# Patient Record
Sex: Female | Born: 1990 | Race: Black or African American | Hispanic: No | Marital: Single | Smoking: Former smoker
Health system: Southern US, Community
[De-identification: ages and names within clinical notes are randomized; demographics above are authoritative.]

## PROBLEM LIST (undated history)

## (undated) ENCOUNTER — Inpatient Hospital Stay (HOSPITAL_COMMUNITY): Payer: Self-pay

## (undated) DIAGNOSIS — K5090 Crohn's disease, unspecified, without complications: Secondary | ICD-10-CM

## (undated) DIAGNOSIS — R569 Unspecified convulsions: Secondary | ICD-10-CM

## (undated) DIAGNOSIS — I10 Essential (primary) hypertension: Secondary | ICD-10-CM

## (undated) DIAGNOSIS — D573 Sickle-cell trait: Secondary | ICD-10-CM

## (undated) DIAGNOSIS — B009 Herpesviral infection, unspecified: Secondary | ICD-10-CM

## (undated) DIAGNOSIS — Z862 Personal history of diseases of the blood and blood-forming organs and certain disorders involving the immune mechanism: Secondary | ICD-10-CM

## (undated) HISTORY — DX: Sickle-cell trait: D57.3

## (undated) HISTORY — PX: CHOLECYSTECTOMY: SHX55

---

## 2011-06-28 ENCOUNTER — Encounter (HOSPITAL_COMMUNITY): Payer: Self-pay | Admitting: Emergency Medicine

## 2011-06-28 ENCOUNTER — Emergency Department (HOSPITAL_COMMUNITY)
Admission: EM | Admit: 2011-06-28 | Discharge: 2011-06-28 | Disposition: A | Discharge status: Home / Self Care | Payer: Medicaid - Out of State | Attending: Emergency Medicine | Admitting: Emergency Medicine

## 2011-06-28 DIAGNOSIS — S0500XA Injury of conjunctiva and corneal abrasion without foreign body, unspecified eye, initial encounter: Secondary | ICD-10-CM

## 2011-06-28 DIAGNOSIS — H5789 Other specified disorders of eye and adnexa: Secondary | ICD-10-CM | POA: Insufficient documentation

## 2011-06-28 DIAGNOSIS — S058X9A Other injuries of unspecified eye and orbit, initial encounter: Secondary | ICD-10-CM | POA: Insufficient documentation

## 2011-06-28 DIAGNOSIS — F17200 Nicotine dependence, unspecified, uncomplicated: Secondary | ICD-10-CM | POA: Insufficient documentation

## 2011-06-28 DIAGNOSIS — H5710 Ocular pain, unspecified eye: Secondary | ICD-10-CM | POA: Insufficient documentation

## 2011-06-28 DIAGNOSIS — T1590XA Foreign body on external eye, part unspecified, unspecified eye, initial encounter: Secondary | ICD-10-CM | POA: Insufficient documentation

## 2011-06-28 MED ORDER — IBUPROFEN 800 MG PO TABS: 800.0000 mg | ORAL_TABLET | Freq: Three times a day (TID) | ORAL | Status: AC

## 2011-06-28 MED ORDER — TRAMADOL HCL 50 MG PO TABS: 50.0000 mg | ORAL_TABLET | Freq: Four times a day (QID) | ORAL | Status: AC | PRN

## 2011-06-28 MED ORDER — TOBRAMYCIN 0.3 % OP SOLN
2.0000 [drp] | Freq: Four times a day (QID) | OPHTHALMIC | Status: DC
  Administered 2011-06-28: 2 [drp] via OPHTHALMIC
  Filled 2011-06-28: qty 5

## 2011-06-28 MED ORDER — TETRACAINE HCL 0.5 % OP SOLN
1.0000 [drp] | Freq: Once | OPHTHALMIC | Status: AC
  Administered 2011-06-28: 1 [drp] via OPHTHALMIC
  Filled 2011-06-28: qty 2

## 2011-06-28 MED ORDER — FLUORESCEIN SODIUM 1 MG OP STRP
1.0000 | ORAL_STRIP | Freq: Once | OPHTHALMIC | Status: AC
Start: 2011-06-28 — End: 2011-06-28
  Administered 2011-06-28: 1 via OPHTHALMIC

## 2011-06-28 NOTE — ED Notes (Signed)
Pt reports being hit in her right eye while "playing around" yesterday and fake eye lash went into her eye. Eye is red and painful 10/10 today.

## 2011-06-28 NOTE — ED Provider Notes (Signed)
Medical screening examination/treatment/procedure(s) were performed by non-physician practitioner and as supervising physician I was immediately available for consultation/collaboration.  Virgel Manifold, MD 06/28/11 1556

## 2011-06-28 NOTE — ED Provider Notes (Signed)
History     CSN: [id number]  Arrival date & time 06/28/11  1302   First MD Initiated Contact with Patient 06/28/11 1305     2:11 PM HPI Patient which was playing with her niece yesterday when she was hit in the right. Reports her artificial eyelash went into her eye causing severe pain. States his morning she woke up with eye redness, eye pain, a drainage. Reports it feels like something is still in her eye, despite having flushed her eyes out  this morning with water.  Patient is a 21 y.o. female presenting with eye pain. The history is provided by the patient.  Eye Pain This is a new problem. The current episode started yesterday. The problem occurs constantly. The problem has been gradually worsening. Pertinent negatives include no chills, congestion, fever, headaches, nausea, neck pain, rash, swollen glands, visual change, vomiting or weakness. She has tried nothing for the symptoms.    History reviewed. No pertinent past medical history.  History reviewed. No pertinent past surgical history.  No family history on file.  History  Substance Use Topics  . Smoking status: Current Everyday Smoker  . Smokeless tobacco: Not on file  . Alcohol Use: Yes    OB History    Grav Para Term Preterm Abortions TAB SAB Ect Mult Living                  Review of Systems  Constitutional: Negative for fever and chills.  HENT: Negative for congestion, rhinorrhea, neck pain and neck stiffness.   Eyes: Positive for photophobia, pain, discharge and redness.  Gastrointestinal: Negative for nausea and vomiting.  Skin: Negative for rash.  Neurological: Negative for dizziness, weakness and headaches.  All other systems reviewed and are negative.    Allergies  Orange juice  Home Medications   Current Outpatient Rx  Name Route Sig Dispense Refill  . NAPROXEN SODIUM 220 MG PO TABS Oral Take 220 mg by mouth 2 (two) times daily with a meal.      BP 117/87  Pulse 91  Temp 98.7 F (37.1  C)  Resp 18  SpO2 100%  Physical Exam  Vitals reviewed. Constitutional: She is oriented to person, place, and time. Vital signs are normal. She appears well-developed and well-nourished. No distress.  HENT:  Head: Normocephalic and atraumatic.  Eyes: EOM and lids are normal. Pupils are equal, round, and reactive to light. Right conjunctiva is injected. Left conjunctiva is not injected. Right eye exhibits normal extraocular motion. Left eye exhibits normal extraocular motion.  Fundoscopic exam:      The right eye shows no hemorrhage.       The left eye shows no hemorrhage.  Slit lamp exam:      The right eye shows corneal abrasion and fluorescein uptake.  Neck: Neck supple.  Pulmonary/Chest: Effort normal.  Neurological: She is alert and oriented to person, place, and time.  Skin: Skin is warm and dry. No rash noted. No erythema. No pallor.  Psychiatric: She has a normal mood and affect. Her behavior is normal.    ED Course  Procedures   MDM   We'll treat patient with tobramycin drops in analgesic medication. Provided patient with referral for ophthalmologist to followup in one to 2 days. Discussed this with patient sugars of plan and is ready for discharge      Sheliah Mends, PA-C 06/28/11 1420

## 2011-06-28 NOTE — Discharge Instructions (Signed)
Corneal Abrasion The cornea is the clear covering at the front and center of the eye. When looking at the colored portion (iris) of the eye, you are looking through that person's cornea.  This very thin tissue is made up of many layers. The surface layer is a single layer of cells called the corneal epithelium. This is one of the most sensitive tissues in the body. If a scratch or injury causes the corneal epithelium to come off, it is called a corneal abrasion. If the injury extends to the tissues below the epithelium, the condition is called a corneal ulcer.  CAUSES   Scratches.   Trauma.   Foreign body in the eye.   Some people have recurrences of abrasions in the area of the original injury even after they heal. This is called recurrent erosion syndrome. Recurrent erosion syndromes generally improve and go away with time.  SYMPTOMS   Eye pain.   Difficulty or inability to keep the injured eye open.   The eye becomes very sensitive to light.   Recurrent erosions tend to happen suddenly, first thing in the morning - usually upon awakening and opening the eyes.  DIAGNOSIS  Your eye professional can diagnose a corneal abrasion during an eye exam. Dye is usually placed in the eye using a drop or a small paper strip moistened by the patient's tears. When the eye is examined with a special light, the abrasion shows up clearly because of the dye. TREATMENT   Small abrasions may be treated with antibiotic drops or ointment alone.   Usually a pressure patch is specially applied. Pressure patches prevent the eye from blinking, allowing the corneal epithelium to heal. Because blinking is less, a pressure patch also reduces the amount of pain present in the eye during healing. Most corneal abrasions heal within 2-3 days with no effect on vision. WARNING: Do not drive or operate machinery while your eye is patched. Your ability to judge distances is impaired.   If abrasion becomes infected and  spreads to the deeper tissues of the cornea, a corneal ulcer can result. This is serious because it can cause corneal scarring. Corneal scars interfere with light passing through the cornea, and cause a loss of vision in the involved eye.   If your caregiver has given you a follow-up appointment, it is very important to keep that appointment. Not keeping the appointment could result in a severe eye infection or permanent loss of vision. If there is any problem keeping the appointment, you must call back to this facility for assistance.  SEEK MEDICAL CARE IF:   You have pain, light sensitivity and a scratchy feeling in one eye (or both).   Your pressure patch keeps loosening up and you can blink your eye under the patch after treatment.   Any kind of discharge develops from the involved eye after treatment or if the lids stick together in the morning.   You have the same symptoms in the morning as you did with the original abrasion days, weeks or months after the abrasion healed.  MAKE SURE YOU:   Understand these instructions.   Will watch your condition.   Will get help right away if you are not doing well or get worse.  Document Released: 01/27/2000 Document Revised: 01/18/2011 Document Reviewed: 09/04/2007 [facility] Patient Information 2012 [place].

## 2011-09-21 ENCOUNTER — Encounter (HOSPITAL_COMMUNITY): Payer: Self-pay | Admitting: Emergency Medicine

## 2011-09-21 ENCOUNTER — Emergency Department (HOSPITAL_COMMUNITY)
Admission: EM | Admit: 2011-09-21 | Discharge: 2011-09-21 | Disposition: A | Discharge status: Home / Self Care | Payer: Medicaid Other | Attending: Emergency Medicine | Admitting: Emergency Medicine

## 2011-09-21 DIAGNOSIS — F17200 Nicotine dependence, unspecified, uncomplicated: Secondary | ICD-10-CM | POA: Insufficient documentation

## 2011-09-21 DIAGNOSIS — S058X9A Other injuries of unspecified eye and orbit, initial encounter: Secondary | ICD-10-CM | POA: Insufficient documentation

## 2011-09-21 DIAGNOSIS — S0500XA Injury of conjunctiva and corneal abrasion without foreign body, unspecified eye, initial encounter: Secondary | ICD-10-CM

## 2011-09-21 DIAGNOSIS — X58XXXA Exposure to other specified factors, initial encounter: Secondary | ICD-10-CM | POA: Insufficient documentation

## 2011-09-21 MED ORDER — CIPROFLOXACIN HCL 0.3 % OP SOLN: 1.0000 [drp] | Freq: Four times a day (QID) | OPHTHALMIC | Status: AC

## 2011-09-21 MED ORDER — HYDROCODONE-ACETAMINOPHEN 5-325 MG PO TABS: 1.0000 | ORAL_TABLET | ORAL | Status: AC | PRN

## 2011-09-21 MED ORDER — PROPARACAINE HCL 0.5 % OP SOLN
2.0000 [drp] | OPHTHALMIC | Status: AC
  Administered 2011-09-21: 2 [drp] via OPHTHALMIC
  Filled 2011-09-21: qty 15

## 2011-09-21 NOTE — ED Provider Notes (Signed)
History     CSN: [id number]  Arrival date & time 09/21/11  1722   First MD Initiated Contact with Patient 09/21/11 1841      Chief Complaint  Patient presents with  . Eye Pain    (Consider location/radiation/quality/duration/timing/severity/associated sxs/prior treatment) HPI Comments: Pt presents with right eye pain. One month ago she was see at the ED for corneal abrasion from fake eyelashes. She was given antibiotic eye drops and tramadol, and the symptoms improved. Last night, patient felt similar pain in her right eye that has continued throughout the day. The pain is constant and uncomfortable. Pt states she has some blurry vision in her right eye and mild headache. Denies trauma or irritation to her eye or any known exposures. Denies pain with eye movement, discharge, eye or eyelid swelling, diplopia, fever, chills.  Pt does not wear contacts.   Patient is a 21 y.o. female presenting with eye pain. The history is provided by the patient.  Eye Pain Pertinent negatives include no chills or fever.    History reviewed. No pertinent past medical history.  History reviewed. No pertinent past surgical history.  No family history on file.  History  Substance Use Topics  . Smoking status: Current Everyday Smoker    Types: Cigarettes  . Smokeless tobacco: Not on file  . Alcohol Use: No    OB History    Grav Para Term Preterm Abortions TAB SAB Ect Mult Living                  Review of Systems  Constitutional: Negative for fever and chills.  Eyes: Positive for pain and redness. Negative for discharge and itching.  Skin: Negative for wound.    Allergies  Orange juice  Home Medications   Current Outpatient Rx  Name Route Sig Dispense Refill  . BC FAST PAIN RELIEF PO Oral Take 1 packet by mouth daily as needed. Dental pain.    Marland Kitchen TRAMADOL HCL 50 MG PO TABS Oral Take 50 mg by mouth every 6 (six) hours as needed. Pain.    Marland Kitchen CIPROFLOXACIN HCL 0.3 % OP SOLN Right Eye Place  1-2 drops into the right eye 4 (four) times daily. Administer 1 drop, every 2 hours, while awake, for 2 days. Then 1 drop, every 4 hours, while awake, for the next 5 days. 5 mL 0  . HYDROCODONE-ACETAMINOPHEN 5-325 MG PO TABS Oral Take 1 tablet by mouth every 4 (four) hours as needed for pain. 15 tablet 0    BP 117/72  Pulse 89  Temp 98.2 F (36.8 C) (Oral)  Resp 16  SpO2 100%  LMP 09/01/2011  Physical Exam  Nursing note and vitals reviewed. Constitutional: She appears well-developed and well-nourished. No distress.  HENT:  Head: Normocephalic and atraumatic.  Eyes: EOM and lids are normal. Pupils are equal, round, and reactive to light. No foreign bodies found. Right eye exhibits no discharge and no exudate. No foreign body present in the right eye. Right conjunctiva is injected. Right conjunctiva has no hemorrhage. Right eye exhibits normal extraocular motion and no nystagmus.  Slit lamp exam:      The right eye shows corneal abrasion and fluorescein uptake.       Right eye with several small corneal abrasions.  Two over lateral aspect, 9:00/10:00 and one over medial aspect approximately 3:00.  No FB seen.    Neck: Neck supple.  Pulmonary/Chest: Effort normal.  Neurological: She is alert.  Skin: She is not diaphoretic.  ED Course  Procedures (including critical care time)  Labs Reviewed - No data to display No results found.   1. Corneal abrasion       MDM  Pt with repeat corneal abrasion to right eye with no known injury, no FB found on exam.  Pt does not wear contacts.  Pt d/c home with norco, cipro drops, ophthalmology follow up.  Discussed diagnosis and follow up with patient.  Pt given return precautions.  Pt verbalizes understanding and agrees with plan.           [place], [place] 09/21/11 1955

## 2011-09-21 NOTE — ED Provider Notes (Signed)
Medical screening examination/treatment/procedure(s) were performed by non-physician practitioner and as supervising physician I was immediately available for consultation/collaboration.   Malvin Johns, MD 09/21/11 [phone]

## 2011-09-21 NOTE — ED Notes (Signed)
Pt presenting to ed with c/o right eye pain. Pt states she was seen here approximately one month ago due to eyelash cutting her eye. Pt states the pain went away and now it's back and she feels like she has a film over her eye. Pt states her vision is not clear

## 2015-06-13 ENCOUNTER — Ambulatory Visit (HOSPITAL_COMMUNITY)
Admission: EM | Admit: 2015-06-13 | Discharge: 2015-06-13 | Disposition: A | Discharge status: Home / Self Care | Payer: Medicaid Other | Attending: Emergency Medicine | Admitting: Emergency Medicine

## 2015-06-13 ENCOUNTER — Encounter (HOSPITAL_COMMUNITY): Payer: Self-pay | Admitting: Emergency Medicine

## 2015-06-13 DIAGNOSIS — S0501XA Injury of conjunctiva and corneal abrasion without foreign body, right eye, initial encounter: Secondary | ICD-10-CM

## 2015-06-13 MED ORDER — CIPROFLOXACIN HCL 0.3 % OP SOLN: OPHTHALMIC | Status: DC

## 2015-06-13 MED ORDER — TETRACAINE HCL 0.5 % OP SOLN
OPHTHALMIC | Status: AC
Start: 2015-06-13 — End: 2015-06-13
  Filled 2015-06-13: qty 2

## 2015-06-13 MED ORDER — POLYETHYL GLYCOL-PROPYL GLYCOL 0.4-0.3 % OP SOLN: 1.0000 [drp] | Freq: Four times a day (QID) | OPHTHALMIC | Status: DC | PRN

## 2015-06-13 MED ORDER — FLUORESCEIN SODIUM 1 MG OP STRP
ORAL_STRIP | OPHTHALMIC | Status: AC
  Filled 2015-06-13: qty 2

## 2015-06-13 NOTE — ED Notes (Signed)
The patient presented to the [facility] with a complaint of right eye pain that started last night. The patient stated that she was evaluated her prior and told that she has dry eyes and was prescribed some eye drops.

## 2015-06-13 NOTE — ED Provider Notes (Signed)
HPI  SUBJECTIVE:  Kaitlyn Howard is a 25 y.o. female who presents with constant, burning right eye pain, redness, starting this morning. Patient feels like there is "something scraping against her eye".Admits to rubbing her eye last night. She reports photophobia, intermittently blurry vision and increased tearing. She tried Clear Eyes which made her symptoms worse. Symptoms also made worse with exposure to light, closing her eyes, blinking. No alleviating factors.she has not tried anything else for this. She denies nausea, vomiting, fevers, headaches, swelling or redness around the eye, crusting, drainage. She does not wear contacts or glasses. States this feels identical to the 2 right-sided corneal abrasions that she has had in the past Past medical history negative for diabetes, hypertension. LMP: Now. PMD: None.   History reviewed. No pertinent past medical history.  History reviewed. No pertinent past surgical history.  History reviewed. No pertinent family history.  Social History  Substance Use Topics  . Smoking status: Current Every Day Smoker    Types: Cigarettes  . Smokeless tobacco: None  . Alcohol Use: No    No current facility-administered medications for this encounter.  Current outpatient prescriptions:  .  Aspirin-Caffeine (BC FAST PAIN RELIEF PO), Take 1 packet by mouth daily as needed. Dental pain., Disp: , Rfl:  .  ciprofloxacin (CILOXAN) 0.3 % ophthalmic solution, 1-2 drops in affected eye 4 times/day x 5 days, Disp: 5 mL, Rfl: 0 .  Polyethyl Glycol-Propyl Glycol (SYSTANE) 0.4-0.3 % SOLN, Apply 1 drop to eye 4 (four) times daily as needed., Disp: 5 mL, Rfl: 0 .  traMADol (ULTRAM) 50 MG tablet, Take 50 mg by mouth every 6 (six) hours as needed. Pain., Disp: , Rfl:   Allergies  Allergen Reactions  . Penicillins   . Orange Juice [Orange Oil] Rash     ROS  As noted in HPI.   Physical Exam  BP 120/80 mmHg  Pulse 68  Temp(Src) 98.3 F (36.8 C)  Resp 12   SpO2 100%  LMP 06/13/2015 (Exact Date)  Constitutional: Well developed, well nourished, no acute distress Eyes:  EOMI, PERRLA, right-sided conjunctival injection. Mild direct photophobia. No consensual photophobia. No foreign body seen on lid eversion. Positive peripheral right-sided corneal abrasion seen from the 8:00 to the 10:00 position.     Visual Acuity  Right Eye Distance: 20/50 Left Eye Distance: 20/40 Bilateral Distance: 20/40  Right Eye Near:   Left Eye Near:    Bilateral Near:    HENT: Normocephalic, atraumatic,mucus membranes moist Respiratory: Normal inspiratory effort Cardiovascular: Normal rate GI: nondistended skin: No rash, skin intact Musculoskeletal: no deformities Neurologic: Alert & oriented x 3, no focal neuro deficits Psychiatric: Speech and behavior appropriate   ED Course   Medications - No data to display  Orders Placed This Encounter  Procedures  . Visual acuity screening    Standing Status: Standing     Number of Occurrences: 1     Standing Expiration Date:     No results found for this or any previous visit (from the past 24 hour(s)). No results found.  ED Clinical Impression  Corneal abrasion, right, initial encounter   ED Assessment/Plan  Slightly decreased visual acuity on the right side. Advised patient that she needs glasses for driving , especially at night, and for seeing distances. Patient declined pain medication. We'll send home with Cipro eyedrops, cool compresses, Systane eye drops, advised patient to wear sunglasses as needed for photophobia. She will follow-up with Dr. Midge Aver, ophthalmology on call if no  better in 1-2 days. Discussed MDM, plan and followup with patient  Discussed sn/sx that should prompt return to the ED. Patient agrees with plan.   *This clinic note was created using Dragon dictation software. Therefore, there may be occasional mistakes despite careful proofreading.  ?   Melynda Ripple,  MD 06/14/15 [phone]

## 2015-06-28 ENCOUNTER — Emergency Department (HOSPITAL_COMMUNITY)
Admission: EM | Admit: 2015-06-28 | Discharge: 2015-06-28 | Disposition: A | Discharge status: Home / Self Care | Payer: Medicaid Other | Attending: Emergency Medicine | Admitting: Emergency Medicine

## 2015-06-28 ENCOUNTER — Encounter (HOSPITAL_COMMUNITY): Payer: Self-pay | Admitting: Emergency Medicine

## 2015-06-28 DIAGNOSIS — X58XXXA Exposure to other specified factors, initial encounter: Secondary | ICD-10-CM | POA: Insufficient documentation

## 2015-06-28 DIAGNOSIS — Y9389 Activity, other specified: Secondary | ICD-10-CM | POA: Insufficient documentation

## 2015-06-28 DIAGNOSIS — T162XXA Foreign body in left ear, initial encounter: Secondary | ICD-10-CM | POA: Diagnosis present

## 2015-06-28 DIAGNOSIS — Y999 Unspecified external cause status: Secondary | ICD-10-CM | POA: Insufficient documentation

## 2015-06-28 DIAGNOSIS — Y929 Unspecified place or not applicable: Secondary | ICD-10-CM | POA: Diagnosis not present

## 2015-06-28 DIAGNOSIS — F17210 Nicotine dependence, cigarettes, uncomplicated: Secondary | ICD-10-CM | POA: Diagnosis not present

## 2015-06-28 DIAGNOSIS — S00452A Superficial foreign body of left ear, initial encounter: Secondary | ICD-10-CM

## 2015-06-28 HISTORY — DX: Personal history of diseases of the blood and blood-forming organs and certain disorders involving the immune mechanism: Z86.2

## 2015-06-28 MED ORDER — LIDOCAINE-EPINEPHRINE-TETRACAINE (LET) SOLUTION: 3.0000 mL | Freq: Once | NASAL | Status: DC

## 2015-06-28 NOTE — ED Provider Notes (Signed)
CSN: [id number]     Arrival date & time 06/28/15  1220 History   By signing my name below, I, Kaitlyn Howard, attest that this documentation has been prepared under the direction and in the presence of non-physician practitioner, Eliezer Mccoy, [place]. Electronically Signed: Rowan Howard, Scribe. 06/28/2015. 1:14 PM.   Chief Complaint  Patient presents with  . Foreign Body in [place] in l/ear lobe   The history is provided by the patient. No language interpreter was used.   HPI Comments:  Kaitlyn Howard is a 25 y.o. female who presents to the Emergency Department complaining of foreign body in left tragus. Pt was at the hairdresser and they accidentally applied pressure to her metal earring, pushing the front of her earring into her skin. Prior to exam, pt was able to push the earring back out of her lobe. She states she needs help taking off the back of the earring and removing it from her ear. Pt recently had this piercing placed and notes difficulty cleaning the piercingSince was placed 4 weeks ago. Pt reports associated mild dizziness. Denies inner ear pain, foreign body in ear, shortness of breath, chest pain, nausea or vomiting  Past Medical History  Diagnosis Date  . H/O sickle cell trait    Past Surgical History  Procedure Laterality Date  . Cesarean section     Family History  Problem Relation Age of Onset  . Hypertension Mother   . Hypertension Father    Social History  Substance Use Topics  . Smoking status: Current Every Day Smoker    Types: Cigarettes  . Smokeless tobacco: None  . Alcohol Use: Yes   OB History    No data available     Review of Systems  Constitutional: Negative for fever and chills.  HENT: Positive for ear pain (external). Negative for facial swelling and sore throat.   Respiratory: Negative for shortness of breath.   Cardiovascular: Negative for chest pain.  Gastrointestinal: Negative for nausea, vomiting and abdominal pain.   Genitourinary: Negative for dysuria.  Musculoskeletal: Negative for back pain.  Skin: Negative for rash and wound.  Neurological: Positive for dizziness. Negative for headaches.  Psychiatric/Behavioral: The patient is not nervous/anxious.    Allergies  Penicillins and Orange juice  Home Medications   Prior to Admission medications   Medication Sig Start Date End Date Taking? Authorizing Provider  Polyethyl Glycol-Propyl Glycol (SYSTANE) 0.4-0.3 % SOLN Apply 1 drop to eye 4 (four) times daily as needed. Patient taking differently: Apply 2 drops to eye daily as needed (painful eyes).  06/13/15  Yes Melynda Ripple, MD  ciprofloxacin (CILOXAN) 0.3 % ophthalmic solution 1-2 drops in affected eye 4 times/day x 5 days Patient not taking: Reported on 06/28/2015 06/13/15   Melynda Ripple, MD   BP 106/72 mmHg  Pulse 78  Temp(Src) 98.4 F (36.9 C)  Resp 16  Wt 54.432 kg  SpO2 100%  LMP 06/28/2015 (Exact Date) Physical Exam  Constitutional: She appears well-developed and well-nourished. No distress.  HENT:  Head: Normocephalic and atraumatic.  Mouth/Throat: Oropharynx is clear and moist. No oropharyngeal exudate.  Stud earring sunken into tragus of left ear  Eyes: Conjunctivae are normal. Pupils are equal, round, and reactive to light. Right eye exhibits no discharge. Left eye exhibits no discharge. No scleral icterus.  Neck: Normal range of motion. Neck supple. No thyromegaly present.  Cardiovascular: Normal rate, regular rhythm, normal heart sounds and intact distal pulses.  Exam reveals no gallop  and no friction rub.   No murmur heard. Pulmonary/Chest: Effort normal and breath sounds normal. No stridor. No respiratory distress. She has no wheezes. She has no rales.  Abdominal: Soft. Bowel sounds are normal. She exhibits no distension. There is no tenderness. There is no rebound and no guarding.  Musculoskeletal: She exhibits no edema.  Lymphadenopathy:    She has no cervical  adenopathy.  Neurological: She is alert. Coordination normal.  Skin: Skin is warm and dry. No rash noted. She is not diaphoretic. No pallor.  Psychiatric: She has a normal mood and affect.  Nursing note and vitals reviewed.   ED Course  Procedures  DIAGNOSTIC STUDIES:  Oxygen Saturation is 100% on RA, normal by my interpretation.    COORDINATION OF CARE:  1:08 PM Will attempt to remove piercing. Discussed treatment plan with pt at bedside and pt agreed to plan.  Labs Review Labs Reviewed - No data to display  Imaging Review No results found. I have personally reviewed and evaluated these images and lab results as part of my medical decision-making.   EKG Interpretation None      MDM   Patient presenting for removal of earring. When trying to remove earring with hemostats and tweezers, patient moved and stated she was in a lot of pain. When I went to get LET to ease removal, a nurse was able to remove earring with fingers by requested the patient. Patient was in a rush to leave. I recommended applying bacitracin ointment for 1 week and not to replace earring. Return precautions discussed. Patient vitals stable throughout ED course and discharged in satisfactory condition.  Final diagnoses:  Foreign body in ear lobe, left, initial encounter    I personally performed the services described in this documentation, which was scribed in my presence. The recorded information has been reviewed and is accurate.    Frederica Kuster, PA-C 06/28/15 1401  Merrily Pew, MD 06/28/15 1620

## 2015-06-28 NOTE — ED Notes (Signed)
Pt stated that she felt a popping sensation in l/earlobe. Pt was having her hair done and earring was pushed into earlobe. Stated that ball of earring is imbedded in l/earlobe

## 2015-06-28 NOTE — Discharge Instructions (Signed)
Apply antibiotic ointment on the affected area for 1 week. Do not put an earring back in the hole. Please return to the emergency department if you develop and fevers, increasing pain, redness, swelling, or drainage, or any other new or concerning symptom.

## 2015-12-30 ENCOUNTER — Emergency Department (HOSPITAL_COMMUNITY)
Admission: EM | Admit: 2015-12-30 | Discharge: 2015-12-30 | Disposition: A | Discharge status: Home / Self Care | Payer: Medicaid Other | Attending: Emergency Medicine | Admitting: Emergency Medicine

## 2015-12-30 ENCOUNTER — Encounter (HOSPITAL_COMMUNITY): Payer: Self-pay | Admitting: Emergency Medicine

## 2015-12-30 DIAGNOSIS — R1900 Intra-abdominal and pelvic swelling, mass and lump, unspecified site: Secondary | ICD-10-CM | POA: Insufficient documentation

## 2015-12-30 DIAGNOSIS — R7989 Other specified abnormal findings of blood chemistry: Secondary | ICD-10-CM

## 2015-12-30 DIAGNOSIS — R1033 Periumbilical pain: Secondary | ICD-10-CM

## 2015-12-30 DIAGNOSIS — E349 Endocrine disorder, unspecified: Secondary | ICD-10-CM

## 2015-12-30 LAB — COMPREHENSIVE METABOLIC PANEL
ALK PHOS: 60 U/L (ref 38–126)
ALT: 24 U/L (ref 14–54)
AST: 27 U/L (ref 15–41)
Albumin: 4 g/dL (ref 3.5–5.0)
Anion gap: 10 (ref 5–15)
BILIRUBIN TOTAL: 0.3 mg/dL (ref 0.3–1.2)
BUN: 5 mg/dL — AB (ref 6–20)
CALCIUM: 9.3 mg/dL (ref 8.9–10.3)
CHLORIDE: 106 mmol/L (ref 101–111)
CO2: 24 mmol/L (ref 22–32)
CREATININE: 0.76 mg/dL (ref 0.44–1.00)
GFR calc Af Amer: >60 mL/min (ref >60)
Glucose, Bld: 69 mg/dL (ref 65–99)
Potassium: 3.6 mmol/L (ref 3.5–5.1)
Sodium: 140 mmol/L (ref 135–145)
Total Protein: 7.1 g/dL (ref 6.5–8.1)

## 2015-12-30 LAB — URINALYSIS, ROUTINE W REFLEX MICROSCOPIC
Bilirubin Urine: NEGATIVE
GLUCOSE, UA: NEGATIVE mg/dL
HGB URINE DIPSTICK: NEGATIVE
KETONES UR: NEGATIVE mg/dL
LEUKOCYTES UA: NEGATIVE
Nitrite: NEGATIVE
PROTEIN: NEGATIVE mg/dL
Specific Gravity, Urine: 1.006 (ref 1.005–1.030)
pH: 6 (ref 5.0–8.0)

## 2015-12-30 LAB — LIPASE, BLOOD: LIPASE: 25 U/L (ref 11–51)

## 2015-12-30 LAB — I-STAT BETA HCG BLOOD, ED (MC, WL, AP ONLY): I-stat hCG, quantitative: 68.3 m[IU]/mL — ABNORMAL HIGH (ref <5)

## 2015-12-30 LAB — CBC
HCT: 39.4 % (ref 36.0–46.0)
Hemoglobin: 13.6 g/dL (ref 12.0–15.0)
MCH: 25.7 pg — AB (ref 26.0–34.0)
MCHC: 34.5 g/dL (ref 30.0–36.0)
MCV: 74.5 fL — AB (ref 78.0–100.0)
PLATELETS: 240 10*3/uL (ref 150–400)
RBC: 5.29 MIL/uL — ABNORMAL HIGH (ref 3.87–5.11)
RDW: 14.6 % (ref 11.5–15.5)
WBC: 10.4 10*3/uL (ref 4.0–10.5)

## 2015-12-30 NOTE — ED Triage Notes (Signed)
Patient reports mid abdominal pain onset 2 days ago , denies emesis or diarrhea , no fever or chills , pt. added small lump at mid abdomen today .

## 2015-12-30 NOTE — ED Provider Notes (Signed)
[facility] DEPT Provider Note   CSN: [id number] Arrival date & time: 12/30/15  1943     History   Chief Complaint Chief Complaint  Patient presents with  . Abdominal Pain    Lump    HPI See Beharry is a 25 y.o. female.  Patient presents with concern for a painful knot or lump in the mid-abdomen x 2 days. No fever, injury, emesis, change in bowel habits. She rates the pain as a 4 on a 10 scale with palpation. It does not affect her appetite or activity.    The history is provided by the patient. No language interpreter was used.  Abdominal Pain   Pertinent negatives include fever, diarrhea, nausea, vomiting, constipation and dysuria.    Past Medical History:  Diagnosis Date  . H/O sickle cell trait     There are no active problems to display for this patient.   Past Surgical History:  Procedure Laterality Date  . CESAREAN SECTION      OB History    No data available       Home Medications    Prior to Admission medications   Medication Sig Start Date End Date Taking? Authorizing Provider  ciprofloxacin (CILOXAN) 0.3 % ophthalmic solution 1-2 drops in affected eye 4 times/day x 5 days Patient not taking: Reported on 12/30/2015 06/13/15   Melynda Ripple, MD  Polyethyl Glycol-Propyl Glycol (SYSTANE) 0.4-0.3 % SOLN Apply 1 drop to eye 4 (four) times daily as needed. Patient not taking: Reported on 12/30/2015 06/13/15   Melynda Ripple, MD    Family History Family History  Problem Relation Age of Onset  . Hypertension Mother   . Hypertension Father     Social History Social History  Substance Use Topics  . Smoking status: Current Every Day Smoker    Types: Cigarettes  . Smokeless tobacco: Never Used  . Alcohol use Yes     Allergies   Penicillins and Orange juice [orange oil]   Review of Systems Review of Systems  Constitutional: Negative for chills and fever.  HENT: Negative.   Respiratory: Negative.   Cardiovascular: Negative.     Gastrointestinal: Positive for abdominal pain. Negative for constipation, diarrhea, nausea and vomiting.  Genitourinary: Negative.  Negative for dysuria and pelvic pain.  Musculoskeletal: Negative.   Neurological: Negative.      Physical Exam Updated Vital Signs BP 100/62 (BP Location: Left Arm)   Pulse 63   Temp 98.4 F (36.9 C) (Oral)   Resp 14   Ht 5' 7"  (1.702 m)   Wt 58.1 kg   LMP 12/23/2015 (Approximate)   SpO2 99%   BMI 20.05 kg/m   Physical Exam  Constitutional: She is oriented to person, place, and time. She appears well-developed and well-nourished.  Neck: Normal range of motion.  Pulmonary/Chest: Effort normal.  Abdominal: Normal appearance. There is tenderness.    Palpable firm, tender 2 cm mass above the umbilicus.  Neurological: She is alert and oriented to person, place, and time.  Skin: Skin is warm and dry.     ED Treatments / Results  Labs (all labs ordered are listed, but only abnormal results are displayed) Labs Reviewed  COMPREHENSIVE METABOLIC PANEL - Abnormal; Notable for the following:       Result Value   BUN 5 (*)    All other components within normal limits  CBC - Abnormal; Notable for the following:    RBC 5.29 (*)    MCV 74.5 (*)    [facility]  25.7 (*)    All other components within normal limits  I-STAT BETA HCG BLOOD, ED ([facility], [facility], [facility] ONLY) - Abnormal; Notable for the following:    I-stat hCG, quantitative 68.3 (*)    All other components within normal limits  LIPASE, BLOOD  URINALYSIS, ROUTINE W REFLEX MICROSCOPIC (NOT AT [facility])  HCG, QUANTITATIVE, PREGNANCY    EKG  EKG Interpretation None       Radiology No results found.  Procedures Procedures (including critical care time)  Medications Ordered in ED Medications - No data to display   Initial Impression / Assessment and Plan / ED Course  I have reviewed the triage vital signs and the nursing notes.  Pertinent labs & imaging results that were available during my care  of the patient were reviewed by me and considered in my medical decision making (see chart for details).  Clinical Course     Patient presents with a tender palpable mass in periumbilical area x 2 days. No other symptoms. Discussed likelihood of a fat containing hernia. She is examined by Dr. Venora Maples and found stable for discharge home.  She has an elevated HCG today. She reports she is on Depo injections and has abnormal periods, noting a heavier than usual period last month. Pregnancy test was discussed by Dr. Venora Maples who advised the patient of the importance in getting a repeat test to determine progression of the pregnancy. [facility]  Final Clinical Impressions(s) / ED Diagnoses   Final diagnoses:  None   1. Abdominal pain 2. Abdominal mass 3. Elevated HCG  New Prescriptions New Prescriptions   No medications on file     Charlann Lange, Hershal Coria 12/30/15 [place], MD 12/30/15 2358

## 2015-12-30 NOTE — ED Notes (Signed)
Pt departed in NAD.  

## 2015-12-31 LAB — HCG, QUANTITATIVE, PREGNANCY: HCG, BETA CHAIN, QUANT, S: 79 m[IU]/mL — AB (ref <5)

## 2016-01-03 ENCOUNTER — Encounter (HOSPITAL_COMMUNITY): Payer: Self-pay | Admitting: Emergency Medicine

## 2016-01-03 ENCOUNTER — Emergency Department (HOSPITAL_COMMUNITY)
Admission: EM | Admit: 2016-01-03 | Discharge: 2016-01-04 | Disposition: A | Discharge status: Home / Self Care | Payer: Medicaid Other | Attending: Emergency Medicine | Admitting: Emergency Medicine

## 2016-01-03 ENCOUNTER — Other Ambulatory Visit: Payer: Self-pay

## 2016-01-03 ENCOUNTER — Telehealth (HOSPITAL_BASED_OUTPATIENT_CLINIC_OR_DEPARTMENT_OTHER): Payer: Self-pay | Admitting: Emergency Medicine

## 2016-01-03 ENCOUNTER — Emergency Department (HOSPITAL_COMMUNITY): Payer: Medicaid Other

## 2016-01-03 DIAGNOSIS — K429 Umbilical hernia without obstruction or gangrene: Secondary | ICD-10-CM | POA: Diagnosis not present

## 2016-01-03 DIAGNOSIS — Z3A01 Less than 8 weeks gestation of pregnancy: Secondary | ICD-10-CM | POA: Diagnosis not present

## 2016-01-03 DIAGNOSIS — R1033 Periumbilical pain: Secondary | ICD-10-CM

## 2016-01-03 DIAGNOSIS — O99611 Diseases of the digestive system complicating pregnancy, first trimester: Secondary | ICD-10-CM | POA: Diagnosis not present

## 2016-01-03 DIAGNOSIS — R1031 Right lower quadrant pain: Secondary | ICD-10-CM

## 2016-01-03 DIAGNOSIS — O99331 Smoking (tobacco) complicating pregnancy, first trimester: Secondary | ICD-10-CM | POA: Insufficient documentation

## 2016-01-03 DIAGNOSIS — Z3490 Encounter for supervision of normal pregnancy, unspecified, unspecified trimester: Secondary | ICD-10-CM

## 2016-01-03 DIAGNOSIS — E349 Endocrine disorder, unspecified: Secondary | ICD-10-CM

## 2016-01-03 DIAGNOSIS — O26891 Other specified pregnancy related conditions, first trimester: Secondary | ICD-10-CM | POA: Diagnosis present

## 2016-01-03 DIAGNOSIS — F17210 Nicotine dependence, cigarettes, uncomplicated: Secondary | ICD-10-CM | POA: Insufficient documentation

## 2016-01-03 LAB — LIPASE, BLOOD: LIPASE: 20 U/L (ref 11–51)

## 2016-01-03 LAB — CBC
HEMATOCRIT: 37 % (ref 36.0–46.0)
HEMOGLOBIN: 12.8 g/dL (ref 12.0–15.0)
MCH: 26.1 pg (ref 26.0–34.0)
MCHC: 34.6 g/dL (ref 30.0–36.0)
MCV: 75.4 fL — AB (ref 78.0–100.0)
Platelets: 226 10*3/uL (ref 150–400)
RBC: 4.91 MIL/uL (ref 3.87–5.11)
RDW: 14.9 % (ref 11.5–15.5)
WBC: 12 10*3/uL — AB (ref 4.0–10.5)

## 2016-01-03 LAB — COMPREHENSIVE METABOLIC PANEL
ALBUMIN: 4 g/dL (ref 3.5–5.0)
ALT: 17 U/L (ref 14–54)
ANION GAP: 7 (ref 5–15)
AST: 15 U/L (ref 15–41)
Alkaline Phosphatase: 59 U/L (ref 38–126)
BILIRUBIN TOTAL: 0.6 mg/dL (ref 0.3–1.2)
BUN: 12 mg/dL (ref 6–20)
CHLORIDE: 106 mmol/L (ref 101–111)
CO2: 26 mmol/L (ref 22–32)
Calcium: 9.1 mg/dL (ref 8.9–10.3)
Creatinine, Ser: 0.72 mg/dL (ref 0.44–1.00)
GFR calc Af Amer: >60 mL/min (ref >60)
Glucose, Bld: 56 mg/dL — ABNORMAL LOW (ref 65–99)
POTASSIUM: 3.2 mmol/L — AB (ref 3.5–5.1)
Sodium: 139 mmol/L (ref 135–145)
TOTAL PROTEIN: 7.1 g/dL (ref 6.5–8.1)

## 2016-01-03 LAB — URINALYSIS, ROUTINE W REFLEX MICROSCOPIC
BILIRUBIN URINE: NEGATIVE
GLUCOSE, UA: NEGATIVE mg/dL
HGB URINE DIPSTICK: NEGATIVE
Ketones, ur: NEGATIVE mg/dL
Leukocytes, UA: NEGATIVE
NITRITE: NEGATIVE
PH: 6 (ref 5.0–8.0)
Protein, ur: NEGATIVE mg/dL
SPECIFIC GRAVITY, URINE: 1.017 (ref 1.005–1.030)

## 2016-01-03 LAB — I-STAT BETA HCG BLOOD, ED (MC, WL, AP ONLY): I-stat hCG, quantitative: 563.8 m[IU]/mL — ABNORMAL HIGH (ref <5)

## 2016-01-03 MED ORDER — SODIUM CHLORIDE 0.9 % IV SOLN
Freq: Once | INTRAVENOUS | Status: AC
  Administered 2016-01-03: via INTRAVENOUS

## 2016-01-03 MED ORDER — HYDROMORPHONE HCL 1 MG/ML IJ SOLN
1.0000 mg | Freq: Once | INTRAMUSCULAR | Status: AC
  Administered 2016-01-03: 1 mg via INTRAVENOUS
  Filled 2016-01-03: qty 1

## 2016-01-03 MED ORDER — ONDANSETRON HCL 4 MG/2ML IJ SOLN
4.0000 mg | Freq: Once | INTRAMUSCULAR | Status: AC
  Administered 2016-01-03: 4 mg via INTRAVENOUS
  Filled 2016-01-03: qty 2

## 2016-01-03 NOTE — ED Triage Notes (Signed)
Patient stated that she has a hernia that she was just in the hospital for. Patient states that she feels like she is getting worse. She is complaining of abdominal pain.

## 2016-01-03 NOTE — ED Provider Notes (Signed)
[name] DEPT Provider Note   CSN: [id number] Arrival date & time: 01/03/16  2018  By signing my name below, I, Kaitlyn Howard, attest that this documentation has been prepared under the direction and in the presence of Junius Creamer, NP Electronically Signed: Soijett Howard, ED Scribe. 01/03/16. 10:38 PM.  History   Chief Complaint Chief Complaint  Patient presents with  . Abdominal Pain    HPI Kaitlyn Howard is a 25 y.o. female who presents to the Emergency Department complaining of gradually worsening abdominal pain onset 6 days ago. Pt notes that she was seen in the ED 4 days ago for similar symptoms and informed that she may have a hernia. Pt reports that her abdominal pain has since radiated from her umbilicus area to her RLQ and that is what prompted her to come into the ED tonight. Pt reports that her LMP was 2 weeks ago and she used depo provera as her contraceptive measures, but she notes that she was a month late for her depo provera. Pt reports that her last pregnancy was in 2011. She states that she has not tried any medications for the relief for her symptoms. She denies vomiting, diarrhea, vaginal discharge, vaginal bleeding, and any other symptoms.     The history is provided by the patient. No language interpreter was used.    Past Medical History:  Diagnosis Date  . H/O sickle cell trait     There are no active problems to display for this patient.   Past Surgical History:  Procedure Laterality Date  . CESAREAN SECTION      OB History    No data available       Home Medications    Prior to Admission medications   Medication Sig Start Date End Date Taking? Authorizing Provider  ciprofloxacin (CILOXAN) 0.3 % ophthalmic solution 1-2 drops in affected eye 4 times/day x 5 days Patient not taking: Reported on 01/03/2016 06/13/15   Melynda Ripple, MD  HYDROcodone-acetaminophen (NORCO/VICODIN) 5-325 MG tablet Take 1 tablet by mouth every 6 (six) hours as  needed for severe pain. 01/04/16   Junius Creamer, NP  Polyethyl Glycol-Propyl Glycol (SYSTANE) 0.4-0.3 % SOLN Apply 1 drop to eye 4 (four) times daily as needed. Patient not taking: Reported on 01/03/2016 06/13/15   Melynda Ripple, MD    Family History Family History  Problem Relation Age of Onset  . Hypertension Mother   . Hypertension Father     Social History Social History  Substance Use Topics  . Smoking status: Current Every Day Smoker    Types: Cigarettes  . Smokeless tobacco: Never Used  . Alcohol use Yes     Allergies   Penicillins and Orange juice [orange oil]   Review of Systems Review of Systems  Gastrointestinal: Positive for abdominal pain. Negative for diarrhea and vomiting.  Genitourinary: Negative for vaginal bleeding and vaginal discharge.  All other systems reviewed and are negative.    Physical Exam Updated Vital Signs BP 103/63 (BP Location: Left Arm)   Pulse 77   Temp 98.7 F (37.1 C) (Oral)   Resp 18   Ht 5' 7"  (1.702 m)   Wt 128 lb (58.1 kg)   LMP 12/15/2015 (Approximate)   SpO2 100%   BMI 20.05 kg/m   Physical Exam  Constitutional: She is oriented to person, place, and time. She appears well-developed and well-nourished. No distress.  HENT:  Head: Normocephalic and atraumatic.  Eyes: EOM are normal.  Neck: Neck supple.  Cardiovascular:  Normal rate, regular rhythm and normal heart sounds.  Exam reveals no gallop and no friction rub.   No murmur heard. Pulmonary/Chest: Effort normal and breath sounds normal. No respiratory distress. She has no wheezes. She has no rales.  Abdominal: Soft. She exhibits no distension. There is tenderness in the right lower quadrant.  Tenderness noted to right mid and RLQ. Small defect noted to umbilicus.   Musculoskeletal: Normal range of motion.  Neurological: She is alert and oriented to person, place, and time.  Skin: Skin is warm and dry.  Psychiatric: She has a normal mood and affect. Her behavior  is normal.  Nursing note and vitals reviewed.    ED Treatments / Results  DIAGNOSTIC STUDIES: Oxygen Saturation is 100% on RA, nl by my interpretation.    COORDINATION OF CARE: 10:34 PM Discussed treatment plan with pt at bedside which includes labs, UA, US OB, and pt agreed to plan.   Labs (all labs ordered are listed, but only abnormal results are displayed) Labs Reviewed  COMPREHENSIVE METABOLIC PANEL - Abnormal; Notable for the following:       Result Value   Potassium 3.2 (*)    Glucose, Bld 56 (*)    All other components within normal limits  CBC - Abnormal; Notable for the following:    WBC 12.0 (*)    MCV 75.4 (*)    All other components within normal limits  I-STAT BETA HCG BLOOD, ED ([facility], [facility], [facility] ONLY) - Abnormal; Notable for the following:    I-stat hCG, quantitative 563.8 (*)    All other components within normal limits  LIPASE, BLOOD  URINALYSIS, ROUTINE W REFLEX MICROSCOPIC (NOT AT [facility])  TYPE AND SCREEN  ABO/RH    EKG  EKG Interpretation None       Radiology US Ob Comp Less 14 Wks  Result Date: 01/04/2016 CLINICAL DATA:  Pelvic pain for 6 days, gradually increasing. Pain radiates from the umbilicus to the right lower quadrant. Early pregnancy. Estimated gestational age by LMP is 2 weeks 5 days. Quantitative beta HCG is 563, rising from 12/30/15. EXAM: OBSTETRIC <14 WK [place] AND TRANSVAGINAL OB US TECHNIQUE: Both transabdominal and transvaginal ultrasound examinations were performed for complete evaluation of the gestation as well as the maternal uterus, adnexal regions, and pelvic cul-de-sac. Transvaginal technique was performed to assess early pregnancy. COMPARISON:  None. FINDINGS: Intrauterine gestational sac: Not identified. Yolk sac:  Not identified. Embryo:  Not identified. Cardiac Activity: Not identified. Maternal uterus/adnexae: Uterus is retroverted. No myometrial mass lesions identified. Endometrial stripe thickness is normal. Uterus measures 8.5 x  4.5 x 4.9 cm transabdominal eat. Endometrial stripe measures 9.4 mm. Right ovary measures 5.8 x 2.6 x 3.3 cm. Left ovary measures 2.9 x 1.9 x 2.5 cm. No abnormal adnexal masses. Normal follicular cysts. Images were obtained at the umbilicus where the pain is localized. A small fat containing periumbilical hernia was noted during Valsalva. No bowel herniation. IMPRESSION: No intrauterine gestational sac, yolk sac, or fetal pole identified. Differential considerations include intrauterine pregnancy too early to be sonographically visualized, missed abortion, or ectopic pregnancy. Followup ultrasound is recommended in 10-14 days for further evaluation. Incidental note of a small fat containing periumbilical hernia. Electronically Signed   By: Lucienne Capers M.D.   On: 01/04/2016 00:45   US Ob Transvaginal  Result Date: 01/04/2016 CLINICAL DATA:  Pelvic pain for 6 days, gradually increasing. Pain radiates from the umbilicus to the right lower quadrant. Early pregnancy. Estimated gestational age by  LMP is 2 weeks 5 days. Quantitative beta HCG is 563, rising from 12/30/15. EXAM: OBSTETRIC <14 WK [place] AND TRANSVAGINAL OB US TECHNIQUE: Both transabdominal and transvaginal ultrasound examinations were performed for complete evaluation of the gestation as well as the maternal uterus, adnexal regions, and pelvic cul-de-sac. Transvaginal technique was performed to assess early pregnancy. COMPARISON:  None. FINDINGS: Intrauterine gestational sac: Not identified. Yolk sac:  Not identified. Embryo:  Not identified. Cardiac Activity: Not identified. Maternal uterus/adnexae: Uterus is retroverted. No myometrial mass lesions identified. Endometrial stripe thickness is normal. Uterus measures 8.5 x 4.5 x 4.9 cm transabdominal eat. Endometrial stripe measures 9.4 mm. Right ovary measures 5.8 x 2.6 x 3.3 cm. Left ovary measures 2.9 x 1.9 x 2.5 cm. No abnormal adnexal masses. Normal follicular cysts. Images were obtained at the  umbilicus where the pain is localized. A small fat containing periumbilical hernia was noted during Valsalva. No bowel herniation. IMPRESSION: No intrauterine gestational sac, yolk sac, or fetal pole identified. Differential considerations include intrauterine pregnancy too early to be sonographically visualized, missed abortion, or ectopic pregnancy. Followup ultrasound is recommended in 10-14 days for further evaluation. Incidental note of a small fat containing periumbilical hernia. Electronically Signed   By: Lucienne Capers M.D.   On: 01/04/2016 00:45    Procedures Procedures (including critical care time)  Medications Ordered in ED Medications  0.9 %  sodium chloride infusion ( Intravenous Stopped 01/04/16 0129)  HYDROmorphone (DILAUDID) injection 1 mg (1 mg Intravenous Given 01/03/16 2347)  ondansetron (ZOFRAN) injection 4 mg (4 mg Intravenous Given 01/03/16 2347)     Initial Impression / Assessment and Plan / ED Course  I have reviewed the triage vital signs and the nursing notes.  Pertinent labs & imaging results that were available during my care of the patient were reviewed by me and considered in my medical decision making (see chart for details).  Clinical Course      Patient quantitative beta is resumed normally.  Her ultrasound shows no gestational sac in the uterus.  No adnexal mass.  I did speak with OB/GYN who request a redraw for quantitative beta and 2 days at MAU.  This was discussed with patient who understands and will follow-up.  She was given strict return parameters  Final Clinical Impressions(s) / ED Diagnoses   Final diagnoses:  Periumbilical abdominal pain  Less than [redacted] weeks gestation of pregnancy  Umbilical hernia without obstruction and without gangrene    New Prescriptions Discharge Medication List as of 01/04/2016  1:28 AM    START taking these medications   Details  HYDROcodone-acetaminophen (NORCO/VICODIN) 5-325 MG tablet Take 1 tablet by  mouth every 6 (six) hours as needed for severe pain., Starting Wed 01/04/2016, Print       I personally performed the services described in this documentation, which was scribed in my presence. The recorded information has been reviewed and is accurate.    Junius Creamer, NP 01/04/16 2126    Junius Creamer, NP 01/05/16 2800    Everlene Balls, MD 01/06/16 [phone]

## 2016-01-04 LAB — TYPE AND SCREEN
ABO/RH(D): A POS
Antibody Screen: NEGATIVE

## 2016-01-04 LAB — ABO/RH: ABO/RH(D): A POS

## 2016-01-04 LAB — HCG, QUANTITATIVE, PREGNANCY: HCG, BETA CHAIN, QUANT, S: 559.3 m[IU]/mL — AB

## 2016-01-04 MED ORDER — HYDROCODONE-ACETAMINOPHEN 5-325 MG PO TABS: 1.0000 | ORAL_TABLET | Freq: Four times a day (QID) | ORAL | 0 refills | Status: DC | PRN

## 2016-01-04 NOTE — Discharge Instructions (Signed)
As discussed.  Her quantitative beta is growing normally.  Today's value is 563 .  I did discuss your situation with OB/GYN who is requesting a repeat quantitative beta in 2 days.  [facility]raw.  If you develop sudden worsening of your pain, vaginal bleeding.  Please go immediately to [facility] for evaluation and intervention.

## 2016-01-07 ENCOUNTER — Inpatient Hospital Stay (HOSPITAL_COMMUNITY)
Admission: AD | Admit: 2016-01-07 | Discharge: 2016-01-07 | Disposition: A | Discharge status: Home / Self Care | Payer: Medicaid Other | Source: Ambulatory Visit | Attending: Obstetrics and Gynecology | Admitting: Obstetrics and Gynecology

## 2016-01-07 ENCOUNTER — Encounter: Payer: Self-pay | Admitting: Student

## 2016-01-07 DIAGNOSIS — F17210 Nicotine dependence, cigarettes, uncomplicated: Secondary | ICD-10-CM | POA: Insufficient documentation

## 2016-01-07 DIAGNOSIS — Z3A01 Less than 8 weeks gestation of pregnancy: Secondary | ICD-10-CM | POA: Diagnosis not present

## 2016-01-07 DIAGNOSIS — O3680X0 Pregnancy with inconclusive fetal viability, not applicable or unspecified: Secondary | ICD-10-CM

## 2016-01-07 DIAGNOSIS — O200 Threatened abortion: Secondary | ICD-10-CM | POA: Diagnosis not present

## 2016-01-07 DIAGNOSIS — Z8249 Family history of ischemic heart disease and other diseases of the circulatory system: Secondary | ICD-10-CM | POA: Diagnosis not present

## 2016-01-07 DIAGNOSIS — D573 Sickle-cell trait: Secondary | ICD-10-CM | POA: Diagnosis not present

## 2016-01-07 DIAGNOSIS — Z880 Allergy status to penicillin: Secondary | ICD-10-CM | POA: Diagnosis not present

## 2016-01-07 DIAGNOSIS — O209 Hemorrhage in early pregnancy, unspecified: Secondary | ICD-10-CM

## 2016-01-07 DIAGNOSIS — R109 Unspecified abdominal pain: Secondary | ICD-10-CM | POA: Diagnosis present

## 2016-01-07 LAB — WET PREP, GENITAL
Clue Cells Wet Prep HPF POC: NONE SEEN
Sperm: NONE SEEN
Trich, Wet Prep: NONE SEEN
YEAST WET PREP: NONE SEEN

## 2016-01-07 LAB — URINALYSIS, ROUTINE W REFLEX MICROSCOPIC
BILIRUBIN URINE: NEGATIVE
Glucose, UA: NEGATIVE mg/dL
KETONES UR: 15 mg/dL — AB
Leukocytes, UA: NEGATIVE
NITRITE: NEGATIVE
PH: 5.5 (ref 5.0–8.0)
Protein, ur: NEGATIVE mg/dL
Specific Gravity, Urine: 1.02 (ref 1.005–1.030)

## 2016-01-07 LAB — URINE MICROSCOPIC-ADD ON

## 2016-01-07 LAB — COMPREHENSIVE METABOLIC PANEL
ALT: 14 U/L (ref 14–54)
AST: 16 U/L (ref 15–41)
Albumin: 4 g/dL (ref 3.5–5.0)
Alkaline Phosphatase: 56 U/L (ref 38–126)
Anion gap: 7 (ref 5–15)
BUN: 8 mg/dL (ref 6–20)
CHLORIDE: 105 mmol/L (ref 101–111)
CO2: 26 mmol/L (ref 22–32)
Calcium: 9.4 mg/dL (ref 8.9–10.3)
Creatinine, Ser: 0.68 mg/dL (ref 0.44–1.00)
Glucose, Bld: 91 mg/dL (ref 65–99)
POTASSIUM: 3.9 mmol/L (ref 3.5–5.1)
Sodium: 138 mmol/L (ref 135–145)
Total Bilirubin: 0.4 mg/dL (ref 0.3–1.2)
Total Protein: 7.4 g/dL (ref 6.5–8.1)

## 2016-01-07 LAB — CBC
HCT: 39.1 % (ref 36.0–46.0)
Hemoglobin: 13.8 g/dL (ref 12.0–15.0)
MCH: 26 pg (ref 26.0–34.0)
MCHC: 35.3 g/dL (ref 30.0–36.0)
MCV: 73.8 fL — ABNORMAL LOW (ref 78.0–100.0)
PLATELETS: 218 10*3/uL (ref 150–400)
RBC: 5.3 MIL/uL — AB (ref 3.87–5.11)
RDW: 15 % (ref 11.5–15.5)
WBC: 14.5 10*3/uL — ABNORMAL HIGH (ref 4.0–10.5)

## 2016-01-07 LAB — HCG, QUANTITATIVE, PREGNANCY: HCG, BETA CHAIN, QUANT, S: 273 m[IU]/mL — AB (ref <5)

## 2016-01-07 NOTE — MAU Provider Note (Signed)
History     CSN: [id number]  Arrival date and time: 01/07/16 3428   First Provider Initiated Contact with Patient 01/07/16 1128      Chief Complaint  Patient presents with  . Abdominal Pain  . Vaginal Bleeding   HPI Kaitlyn Howard is a 25 y.o. G2P1000 at 43w2dby LMP who presents with abdominal pain & vaginal bleeding. Pt was seen at ED twice last week. [facility] appropriately rising [facility]. Patient here today b/c pain has continued. Describes right sides abdominal pain that comes & goes. Rates pain 7/10. Has not treated. Nothing makes better or worse. Red spotting began this morning. Currently no bleeding. Denies n/v/d, constipation, or dysuria.   OB History    Gravida Para Term Preterm AB Living   2 1 1          SAB TAB Ectopic Multiple Live Births           1      Past Medical History:  Diagnosis Date  . H/O sickle cell trait     Past Surgical History:  Procedure Laterality Date  . CESAREAN SECTION      Family History  Problem Relation Age of Onset  . Hypertension Mother   . Hypertension Father     Social History  Substance Use Topics  . Smoking status: Current Every Day Smoker    Types: Cigarettes  . Smokeless tobacco: Never Used  . Alcohol use Yes    Allergies:  Allergies  Allergen Reactions  . Penicillins     Has patient had a PCN reaction causing immediate rash, facial/tongue/throat swelling, SOB or lightheadedness with hypotension: No Has patient had a PCN reaction causing severe rash involving mucus membranes or skin necrosis: No Has patient had a PCN reaction that required hospitalization No Has patient had a PCN reaction occurring within the last 10 years: Yes If all of the above answers are "NO", then may proceed with Cephalosporin use.   .Haig ProphetJuice [Orange Oil] Rash    Prescriptions Prior to Admission  Medication Sig Dispense Refill Last Dose  . Polyethyl Glycol-Propyl Glycol (SYSTANE) 0.4-0.3 %  SOLN Apply 1 drop to eye 4 (four) times daily as needed. 5 mL 0 Past Month at Unknown time  . ciprofloxacin (CILOXAN) 0.3 % ophthalmic solution 1-2 drops in affected eye 4 times/day x 5 days (Patient not taking: Reported on 01/07/2016) 5 mL 0 Not Taking at Unknown time  . HYDROcodone-acetaminophen (NORCO/VICODIN) 5-325 MG tablet Take 1 tablet by mouth every 6 (six) hours as needed for severe pain. (Patient not taking: Reported on 01/07/2016) 10 tablet 0 Not Taking at Unknown time    Review of Systems  Constitutional: Negative for chills and fever.  Gastrointestinal: Positive for abdominal pain. Negative for constipation, diarrhea, nausea and vomiting.  Genitourinary: Negative for dysuria.       + vaginal bleeding   Physical Exam   Blood pressure 112/67, pulse 93, temperature 98.3 F (36.8 C), temperature source Oral, resp. rate 16, height 5' 6.5" (1.689 m), weight 124 lb 6.4 oz (56.4 kg), last menstrual period 12/15/2015.  Physical Exam  Nursing note and vitals reviewed. Constitutional: She is oriented to person, place, and time. She appears well-developed and well-nourished. No distress.  HENT:  Head: Normocephalic and atraumatic.  Eyes: Conjunctivae are normal. Right eye exhibits no discharge. Left eye exhibits no discharge. No scleral icterus.  Neck: Normal range of motion.  Cardiovascular: Normal  rate, regular rhythm and normal heart sounds.   No murmur heard. Respiratory: Effort normal and breath sounds normal. No respiratory distress. She has no wheezes.  GI: Soft. Bowel sounds are normal. She exhibits no distension. There is no tenderness. There is no rebound and no guarding.  Genitourinary: Uterus normal. Cervix exhibits no motion tenderness. Right adnexum displays no mass and no tenderness. Left adnexum displays no mass and no tenderness.  Genitourinary Comments: Cervix closed  Neurological: She is alert and oriented to person, place, and time.  Skin: Skin is warm and dry. She  is not diaphoretic.  Psychiatric: She has a normal mood and affect. Her behavior is normal. Judgment and thought content normal.    MAU Course  Procedures Results for orders placed or performed during the hospital encounter of 01/07/16 (from the past 24 hour(s))  Urinalysis, Routine w reflex microscopic (not at [facility])     Status: Abnormal   Collection Time: 01/07/16  8:50 AM  Result Value Ref Range   Color, Urine YELLOW YELLOW   APPearance CLEAR CLEAR   Specific Gravity, Urine 1.020 1.005 - 1.030   pH 5.5 5.0 - 8.0   Glucose, UA NEGATIVE NEGATIVE mg/dL   Hgb urine dipstick LARGE (A) NEGATIVE   Bilirubin Urine NEGATIVE NEGATIVE   Ketones, ur 15 (A) NEGATIVE mg/dL   Protein, ur NEGATIVE NEGATIVE mg/dL   Nitrite NEGATIVE NEGATIVE   Leukocytes, UA NEGATIVE NEGATIVE  Urine microscopic-add on     Status: Abnormal   Collection Time: 01/07/16  8:50 AM  Result Value Ref Range   Squamous Epithelial / LPF 0-5 (A) NONE SEEN   WBC, UA 0-5 0 - 5 WBC/hpf   RBC / HPF 0-5 0 - 5 RBC/hpf   Bacteria, UA FEW (A) NONE SEEN  CBC     Status: Abnormal   Collection Time: 01/07/16 10:15 AM  Result Value Ref Range   WBC 14.5 (H) 4.0 - 10.5 K/uL   RBC 5.30 (H) 3.87 - 5.11 MIL/uL   Hemoglobin 13.8 12.0 - 15.0 g/dL   HCT 39.1 36.0 - 46.0 %   MCV 73.8 (L) 78.0 - 100.0 fL   MCH 26.0 26.0 - 34.0 pg   MCHC 35.3 30.0 - 36.0 g/dL   RDW 15.0 11.5 - 15.5 %   Platelets 218 150 - 400 K/uL  hCG, quantitative, pregnancy     Status: Abnormal   Collection Time: 01/07/16 10:15 AM  Result Value Ref Range   hCG, Beta Chain, Quant, S 273 (H) <5 mIU/mL  Comprehensive metabolic panel     Status: None   Collection Time: 01/07/16 10:15 AM  Result Value Ref Range   Sodium 138 135 - 145 mmol/L   Potassium 3.9 3.5 - 5.1 mmol/L   Chloride 105 101 - 111 mmol/L   CO2 26 22 - 32 mmol/L   Glucose, Bld 91 65 - 99 mg/dL   BUN 8 6 - 20 mg/dL   Creatinine, Ser 0.68 0.44 - 1.00 mg/dL   Calcium 9.4 8.9 - 10.3 mg/dL   Total  Protein 7.4 6.5 - 8.1 g/dL   Albumin 4.0 3.5 - 5.0 g/dL   AST 16 15 - 41 U/L   ALT 14 14 - 54 U/L   Alkaline Phosphatase 56 38 - 126 U/L   Total Bilirubin 0.4 0.3 - 1.2 mg/dL   GFR calc non Af Amer >60 >60 mL/min   GFR calc Af Amer >60 >60 mL/min   Anion gap 7 5 -  15  Wet prep, genital     Status: Abnormal   Collection Time: 01/07/16 11:42 AM  Result Value Ref Range   Yeast Wet Prep HPF POC NONE SEEN NONE SEEN   Trich, Wet Prep NONE SEEN NONE SEEN   Clue Cells Wet Prep HPF POC NONE SEEN NONE SEEN   WBC, Wet Prep HPF POC FEW (A) NONE SEEN   Sperm NONE SEEN     MDM CBC, BHCG A positive BHCG has dropped from previous draw 4 days ago   Component     Latest Ref Rng & Units 12/30/2015 01/03/2016 01/07/2016  HCG, Beta Chain, Quant, S     <5 mIU/mL 79 (H) 559.3 (H) 273 (H)   VSS, NAD GC/CT & wet prep collected today Discussed with patient that this is likely a miscarriage d/t significant drop in BHCG; will have pt return in 48 hrs for repeat BHCG. Pt appropriately upset.  Assessment and Plan  A: 1. Threatened miscarriage   2. Pregnancy of unknown anatomic location   3. Vaginal bleeding in pregnancy, first trimester    P: Discharge home GC/CT pending Go to [facility] Monday morning for repeat BHCG Pelvic rest Discussed reasons to return to [place] 01/07/2016, 11:27 AM

## 2016-01-07 NOTE — Discharge Instructions (Signed)
Threatened Miscarriage A threatened miscarriage occurs when you have vaginal bleeding during your first 20 weeks of pregnancy but the pregnancy has not ended. If you have vaginal bleeding during this time, your health care provider will do tests to make sure you are still pregnant. If the tests show you are still pregnant and the developing baby (fetus) inside your womb (uterus) is still growing, your condition is considered a threatened miscarriage. A threatened miscarriage does not mean your pregnancy will end, but it does increase the risk of losing your pregnancy (complete miscarriage). What are the causes? The cause of a threatened miscarriage is usually not known. If you go on to have a complete miscarriage, the most common cause is an abnormal number of chromosomes in the developing baby. Chromosomes are the structures inside cells that hold all your genetic material. Some causes of vaginal bleeding that do not result in miscarriage include:  Having sex.  Having an infection.  Normal hormone changes of pregnancy.  Bleeding that occurs when an egg implants in your uterus. What increases the risk? Risk factors for bleeding in early pregnancy include:  Obesity.  Smoking.  Drinking excessive amounts of alcohol or caffeine.  Recreational drug use. What are the signs or symptoms?  Light vaginal bleeding.  Mild abdominal pain or cramps. How is this diagnosed? If you have bleeding with or without abdominal pain before 20 weeks of pregnancy, your health care provider will do tests to check whether you are still pregnant. One important test involves using sound waves and a computer (ultrasound) to create images of the inside of your uterus. Other tests include an internal exam of your vagina and uterus (pelvic exam) and measurement of your babys heart rate. You may be diagnosed with a threatened miscarriage if:  Ultrasound testing shows you are still pregnant.  Your babys heart rate  is strong.  A pelvic exam shows that the opening between your uterus and your vagina (cervix) is closed.  Your heart rate and blood pressure are stable.  Blood tests confirm you are still pregnant. How is this treated? No treatments have been shown to prevent a threatened miscarriage from going on to a complete miscarriage. However, the right home care is important. Follow these instructions at home:  Make sure you keep all your appointments for prenatal care. This is very important.  Get plenty of rest.  Do not have sex or use tampons if you have vaginal bleeding.  Do not douche.  Do not smoke or use recreational drugs.  Do not drink alcohol.  Avoid caffeine. Contact a health care provider if:  You have light vaginal bleeding or spotting while pregnant.  You have abdominal pain or cramping.  You have a fever. Get help right away if:  You have heavy vaginal bleeding.  You have blood clots coming from your vagina.  You have severe low back pain or abdominal cramps.  You have fever, chills, and severe abdominal pain. This information is not intended to replace advice given to you by your health care provider. Make sure you discuss any questions you have with your health care provider. Document Released: 01/29/2005 Document Revised: 07/07/2015 Document Reviewed: 11/25/2012 Elsevier Interactive Patient Education  2017 [employer].

## 2016-01-07 NOTE — MAU Note (Signed)
Went to the hosp last wk for a hernia. Found out she was pregnant.  In her instructions, it said if she had bleeding or the pain got worse, to come here.  Started bleeding this morning, red- sees when wipes.  Pain is worse, in RUQ. No longer having pain related to hernia.

## 2016-01-09 ENCOUNTER — Telehealth: Payer: Self-pay | Admitting: General Practice

## 2016-01-09 ENCOUNTER — Ambulatory Visit: Payer: Self-pay | Admitting: General Practice

## 2016-01-09 DIAGNOSIS — O3680X0 Pregnancy with inconclusive fetal viability, not applicable or unspecified: Secondary | ICD-10-CM

## 2016-01-09 LAB — HCG, QUANTITATIVE, PREGNANCY: HCG, BETA CHAIN, QUANT, S: 74 m[IU]/mL — AB (ref <5)

## 2016-01-09 NOTE — Telephone Encounter (Signed)
Called patient with bhcg results & advised her of recommended follow up in one week. Patient verbalized understanding & asked why the repeat level. Told patient we want to ensure the numbers are returning to normal <2 which would also let us know that nothing has been retained. Patient verbalized understanding & had no questions. Patient states she will come 12/4 @ 8am for repeat lab.

## 2016-01-09 NOTE — Progress Notes (Signed)
Patient here today for stat bhcg. Patient reports continued abdominal pain but does not want to wait around for results. Per Dr Ilda Basset it is okay to call patient with results. Patient informed I will contact her with results around 130 and to be expecting my call. Patient left contact number [phone]. Spoke with Dr Kennon Rounds regarding patient results who advises repeat bhcg in one week. Will call patient with results.

## 2016-01-10 ENCOUNTER — Ambulatory Visit: Payer: Self-pay

## 2016-01-11 LAB — GC/CHLAMYDIA PROBE AMP (~~LOC~~) NOT AT ARMC
Chlamydia: NEGATIVE
NEISSERIA GONORRHEA: NEGATIVE

## 2016-04-04 ENCOUNTER — Inpatient Hospital Stay (HOSPITAL_COMMUNITY)
Admission: AD | Admit: 2016-04-04 | Discharge: 2016-04-05 | Disposition: A | Discharge status: Home / Self Care | Payer: Medicaid Other | Source: Ambulatory Visit | Attending: Obstetrics & Gynecology | Admitting: Obstetrics & Gynecology

## 2016-04-04 DIAGNOSIS — O9989 Other specified diseases and conditions complicating pregnancy, childbirth and the puerperium: Secondary | ICD-10-CM | POA: Insufficient documentation

## 2016-04-04 DIAGNOSIS — O26899 Other specified pregnancy related conditions, unspecified trimester: Secondary | ICD-10-CM | POA: Diagnosis not present

## 2016-04-04 DIAGNOSIS — Z3A01 Less than 8 weeks gestation of pregnancy: Secondary | ICD-10-CM | POA: Diagnosis not present

## 2016-04-04 DIAGNOSIS — Z880 Allergy status to penicillin: Secondary | ICD-10-CM | POA: Diagnosis not present

## 2016-04-04 DIAGNOSIS — Z3491 Encounter for supervision of normal pregnancy, unspecified, first trimester: Secondary | ICD-10-CM

## 2016-04-04 DIAGNOSIS — R109 Unspecified abdominal pain: Secondary | ICD-10-CM | POA: Diagnosis not present

## 2016-04-04 DIAGNOSIS — N730 Acute parametritis and pelvic cellulitis: Secondary | ICD-10-CM

## 2016-04-04 DIAGNOSIS — O99331 Smoking (tobacco) complicating pregnancy, first trimester: Secondary | ICD-10-CM | POA: Insufficient documentation

## 2016-04-04 DIAGNOSIS — F17210 Nicotine dependence, cigarettes, uncomplicated: Secondary | ICD-10-CM | POA: Diagnosis not present

## 2016-04-04 NOTE — MAU Note (Signed)
Pt presents complaining of abdominal pain x2 weeks. States she had a positive pregnancy test at home. Denies abnormal discharge or bleeding. LMP unknown.

## 2016-04-04 NOTE — MAU Note (Signed)
Pt c/o right sided pain x2 days. Starts at RUQ and radiates to RLQ. Feels like constant pressure-rates 6/10. Has not taken anything for it. +upt at home 4 days ago. Denies vag bleeding and discharge. Had a recent miscarriage-around November.

## 2016-04-05 ENCOUNTER — Encounter (HOSPITAL_COMMUNITY): Payer: Self-pay

## 2016-04-05 ENCOUNTER — Inpatient Hospital Stay (HOSPITAL_COMMUNITY): Payer: Medicaid Other

## 2016-04-05 DIAGNOSIS — N730 Acute parametritis and pelvic cellulitis: Secondary | ICD-10-CM | POA: Diagnosis not present

## 2016-04-05 DIAGNOSIS — O26899 Other specified pregnancy related conditions, unspecified trimester: Secondary | ICD-10-CM | POA: Diagnosis not present

## 2016-04-05 DIAGNOSIS — R109 Unspecified abdominal pain: Secondary | ICD-10-CM

## 2016-04-05 DIAGNOSIS — Z3491 Encounter for supervision of normal pregnancy, unspecified, first trimester: Secondary | ICD-10-CM

## 2016-04-05 LAB — URINALYSIS, ROUTINE W REFLEX MICROSCOPIC
Bilirubin Urine: NEGATIVE
GLUCOSE, UA: NEGATIVE mg/dL
Hgb urine dipstick: NEGATIVE
KETONES UR: NEGATIVE mg/dL
Leukocytes, UA: NEGATIVE
Nitrite: NEGATIVE
PROTEIN: NEGATIVE mg/dL
Specific Gravity, Urine: 1.014 (ref 1.005–1.030)
pH: 6 (ref 5.0–8.0)

## 2016-04-05 LAB — GC/CHLAMYDIA PROBE AMP (~~LOC~~) NOT AT ARMC
CHLAMYDIA, DNA PROBE: NEGATIVE
Neisseria Gonorrhea: NEGATIVE

## 2016-04-05 LAB — WET PREP, GENITAL
Clue Cells Wet Prep HPF POC: NONE SEEN
SPERM: NONE SEEN
Trich, Wet Prep: NONE SEEN
Yeast Wet Prep HPF POC: NONE SEEN

## 2016-04-05 LAB — CBC
HCT: 31.4 % — ABNORMAL LOW (ref 36.0–46.0)
Hemoglobin: 11.4 g/dL — ABNORMAL LOW (ref 12.0–15.0)
MCH: 25.7 pg — ABNORMAL LOW (ref 26.0–34.0)
MCHC: 36.3 g/dL — AB (ref 30.0–36.0)
MCV: 70.7 fL — AB (ref 78.0–100.0)
PLATELETS: 217 10*3/uL (ref 150–400)
RBC: 4.44 MIL/uL (ref 3.87–5.11)
RDW: 15.4 % (ref 11.5–15.5)
WBC: 13.5 10*3/uL — ABNORMAL HIGH (ref 4.0–10.5)

## 2016-04-05 LAB — HCG, QUANTITATIVE, PREGNANCY: hCG, Beta Chain, Quant, S: 91909 m[IU]/mL — ABNORMAL HIGH (ref <5)

## 2016-04-05 LAB — HIV ANTIBODY (ROUTINE TESTING W REFLEX): HIV Screen 4th Generation wRfx: NONREACTIVE

## 2016-04-05 LAB — POCT PREGNANCY, URINE: PREG TEST UR: POSITIVE — AB

## 2016-04-05 MED ORDER — CONCEPT OB 130-92.4-1 MG PO CAPS: 1.0000 | ORAL_CAPSULE | Freq: Every day | ORAL | 12 refills | Status: DC

## 2016-04-05 MED ORDER — METRONIDAZOLE 500 MG PO TABS: 500.0000 mg | ORAL_TABLET | Freq: Once | ORAL | 0 refills | Status: AC

## 2016-04-05 MED ORDER — AZITHROMYCIN 500 MG PO TABS: 1000.0000 mg | ORAL_TABLET | Freq: Once | ORAL | 0 refills | Status: AC

## 2016-04-05 MED ORDER — PROMETHAZINE HCL 25 MG PO TABS: 25.0000 mg | ORAL_TABLET | Freq: Four times a day (QID) | ORAL | 1 refills | Status: DC | PRN

## 2016-04-05 MED ORDER — CEFTRIAXONE SODIUM 250 MG IJ SOLR
250.0000 mg | Freq: Once | INTRAMUSCULAR | Status: AC
  Administered 2016-04-05: 250 mg via INTRAMUSCULAR
  Filled 2016-04-05: qty 250

## 2016-04-05 NOTE — Accreditation Note (Signed)
[employer] notified - pt. Ready for U/S.

## 2016-04-05 NOTE — MAU Provider Note (Signed)
Chief Complaint: Abdominal Pain   First Provider Initiated Contact with Patient 04/05/16 0224     SUBJECTIVE HPI: Kaitlyn Howard is a 26 y.o. G23P1011 female who presents to [facility] reporting right-sided pain 2 weeks and positive home UPT. Had presumed miscarriage in December, but did not follow-up for hCG levels. Is currently sexually active with one sex partner that she has been with 1 year. Plans termination.  Location: Right upper and lower abdomen Quality: Sharp upper abdominal pain. Crampy low abdominal pain. Severity: 6/10 on pain scale Duration: 2 weeks Course: Mild worsening Context: Possible early pregnancy Timing: Intermittent Modifying factors: None. Hasn't tried anything for the pain. No relationship to eating, voiding or defecation. Associated signs and symptoms: Positive for nausea and dyspareunia. Negative for fever, chills, decreased appetite, vomiting, constipation, vaginal bleeding or vaginal discharge. Patient has chronic diarrhea. No change from baseline.  Past Medical History:  Diagnosis Date  . H/O sickle cell trait    OB History  Gravida Para Term Preterm AB Living  3 1 1   1 1   SAB TAB Ectopic Multiple Live Births  1       1    # Outcome Date GA Lbr Len/2nd Weight Sex Delivery Anes PTL Lv  3 Current           2 SAB           1 Term     M CS-LTranv   LIV     Past Surgical History:  Procedure Laterality Date  . CESAREAN SECTION     Social History   Social History  . Marital status: Single    Spouse name: N/A  . Number of children: N/A  . Years of education: N/A   Occupational History  . Not on file.   Social History Main Topics  . Smoking status: Current Every Day Smoker    Types: Cigarettes  . Smokeless tobacco: Never Used  . Alcohol use Yes  . Drug use: Yes    Types: Marijuana  . Sexual activity: No   Other Topics Concern  . Not on file   Social History Narrative  . No narrative on file   No current  facility-administered medications on file prior to encounter.    Current Outpatient Prescriptions on File Prior to Encounter  Medication Sig Dispense Refill  . Polyethyl Glycol-Propyl Glycol (SYSTANE) 0.4-0.3 % SOLN Apply 1 drop to eye 4 (four) times daily as needed. 5 mL 0   Allergies  Allergen Reactions  . Penicillins Hives    Has patient had a PCN reaction causing immediate rash, facial/tongue/throat swelling, SOB or lightheadedness with hypotension: No Has patient had a PCN reaction causing severe rash involving mucus membranes or skin necrosis: No Has patient had a PCN reaction that required hospitalization No Has patient had a PCN reaction occurring within the last 10 years: Yes If all of the above answers are "NO", then may proceed with Cephalosporin use-----------Has taken Rocephin w/out reaction.    Haig Prophet Juice [Orange Oil] Rash    I have reviewed the past Medical Hx, Surgical Hx, Social Hx, Allergies and Medications.   Review of Systems  Constitutional: Negative for appetite change, chills, fatigue and fever.  Gastrointestinal: Positive for abdominal pain, diarrhea and nausea. Negative for abdominal distention, blood in stool, constipation and vomiting.  Genitourinary: Positive for dyspareunia and pelvic pain. Negative for difficulty urinating, dysuria, flank pain, frequency, hematuria, urgency, vaginal bleeding and vaginal discharge.  Musculoskeletal: Negative for back pain.  OBJECTIVE Patient Vitals for the past 24 hrs:  BP Temp Temp src Pulse Resp Height Weight  04/05/16 0253 107/71 98.6 F (37 C) Oral 86 18 - -  04/05/16 0054 108/65 98.4 F (36.9 C) Oral 93 17 - -  04/04/16 2348 123/77 98 F (36.7 C) Oral 106 18 5' 7"  (1.702 m) 124 lb (56.2 kg)   Constitutional: Well-developed, well-nourished female in no acute distress.  Cardiovascular: normal rate Respiratory: normal rate and effort.  GI: Abd soft, non-tender. No rebound tenderness, masses or guarding. Pos  BS x 4 MS: Extremities nontender, no edema, normal ROM Neurologic: Alert and oriented x 4.  GU: Neg CVAT.  SPECULUM EXAM: NEFG, physiologic discharge, no blood noted, cervix clean  BIMANUAL: cervix closed; uterus 8-week size, no adnexal tenderness or masses. Positive CMT.  LAB RESULTS Results for orders placed or performed during the hospital encounter of 04/04/16 (from the past 24 hour(s))  Urinalysis, Routine w reflex microscopic     Status: None   Collection Time: 04/04/16 11:45 PM  Result Value Ref Range   Color, Urine YELLOW YELLOW   APPearance CLEAR CLEAR   Specific Gravity, Urine 1.014 1.005 - 1.030   pH 6.0 5.0 - 8.0   Glucose, UA NEGATIVE NEGATIVE mg/dL   Hgb urine dipstick NEGATIVE NEGATIVE   Bilirubin Urine NEGATIVE NEGATIVE   Ketones, ur NEGATIVE NEGATIVE mg/dL   Protein, ur NEGATIVE NEGATIVE mg/dL   Nitrite NEGATIVE NEGATIVE   Leukocytes, UA NEGATIVE NEGATIVE  Pregnancy, urine POC     Status: Abnormal   Collection Time: 04/05/16 12:16 AM  Result Value Ref Range   Preg Test, Ur POSITIVE (A) NEGATIVE  hCG, quantitative, pregnancy     Status: Abnormal   Collection Time: 04/05/16 12:50 AM  Result Value Ref Range   hCG, Beta Chain, Quant, S 91,909 (H) <5 mIU/mL  CBC     Status: Abnormal   Collection Time: 04/05/16 12:50 AM  Result Value Ref Range   WBC 13.5 (H) 4.0 - 10.5 K/uL   RBC 4.44 3.87 - 5.11 MIL/uL   Hemoglobin 11.4 (L) 12.0 - 15.0 g/dL   HCT 31.4 (L) 36.0 - 46.0 %   MCV 70.7 (L) 78.0 - 100.0 fL   MCH 25.7 (L) 26.0 - 34.0 pg   MCHC 36.3 (H) 30.0 - 36.0 g/dL   RDW 15.4 11.5 - 15.5 %   Platelets 217 150 - 400 K/uL  Wet prep, genital     Status: Abnormal   Collection Time: 04/05/16 12:55 AM  Result Value Ref Range   Yeast Wet Prep HPF POC NONE SEEN NONE SEEN   Trich, Wet Prep NONE SEEN NONE SEEN   Clue Cells Wet Prep HPF POC NONE SEEN NONE SEEN   WBC, Wet Prep HPF POC FEW (A) NONE SEEN   Sperm NONE SEEN     IMAGING US Ob Comp Less 14 Wks  Result  Date: 04/05/2016 CLINICAL DATA:  Subacute onset of right lateral abdominal pain. Initial encounter. EXAM: OBSTETRIC <14 WK [place] AND TRANSVAGINAL OB US TECHNIQUE: Both transabdominal and transvaginal ultrasound examinations were performed for complete evaluation of the gestation as well as the maternal uterus, adnexal regions, and pelvic cul-de-sac. Transvaginal technique was performed to assess early pregnancy. COMPARISON:  Pelvic ultrasound performed 01/03/2016 FINDINGS: Intrauterine gestational sac: Single; visualized and normal in shape. Yolk sac:  Yes Embryo:  Yes Cardiac Activity: Yes Heart Rate: 158  bpm CRL:  1.38 cm   7 w   4 d  [place] EDC: 11/18/2016 Subchorionic hemorrhage: A small amount of subchorionic hemorrhage is noted. Maternal uterus/adnexae: The uterus is otherwise unremarkable. The ovaries are within normal limits. The right ovary measures 4.1 x 2.8 x 3.0 cm, while the left ovary measures 3.7 x 1.9 x 2.0 cm. No suspicious adnexal masses are seen; there is no evidence for ovarian torsion. No free fluid is seen within the pelvic cul-de-sac. IMPRESSION: 1. Single live intrauterine pregnancy noted, with a crown-rump length of 1.4 cm, corresponding to a gestational age of [redacted] weeks 4 days. This reflects an estimated date of delivery of November 18, 2016. 2. Small amount of subchorionic hemorrhage noted. Electronically Signed   By: Garald Balding M.D.   On: 04/05/2016 01:49   US Ob Transvaginal  Result Date: 04/05/2016 CLINICAL DATA:  Subacute onset of right lateral abdominal pain. Initial encounter. EXAM: OBSTETRIC <14 WK [place] AND TRANSVAGINAL OB US TECHNIQUE: Both transabdominal and transvaginal ultrasound examinations were performed for complete evaluation of the gestation as well as the maternal uterus, adnexal regions, and pelvic cul-de-sac. Transvaginal technique was performed to assess early pregnancy. COMPARISON:  Pelvic ultrasound performed 01/03/2016 FINDINGS: Intrauterine gestational  sac: Single; visualized and normal in shape. Yolk sac:  Yes Embryo:  Yes Cardiac Activity: Yes Heart Rate: 158  bpm CRL:  1.38 cm   7 w   4 d                  [place] EDC: 11/18/2016 Subchorionic hemorrhage: A small amount of subchorionic hemorrhage is noted. Maternal uterus/adnexae: The uterus is otherwise unremarkable. The ovaries are within normal limits. The right ovary measures 4.1 x 2.8 x 3.0 cm, while the left ovary measures 3.7 x 1.9 x 2.0 cm. No suspicious adnexal masses are seen; there is no evidence for ovarian torsion. No free fluid is seen within the pelvic cul-de-sac. IMPRESSION: 1. Single live intrauterine pregnancy noted, with a crown-rump length of 1.4 cm, corresponding to a gestational age of [redacted] weeks 4 days. This reflects an estimated date of delivery of November 18, 2016. 2. Small amount of subchorionic hemorrhage noted. Electronically Signed   By: Garald Balding M.D.   On: 04/05/2016 01:49    MAU COURSE CBC, Quant, ABO/Rh, ultrasound, wet prep and GC/chlamydia culture, UA. Declines pain medication.  Rocephin given. Requests prescriptions for remaining medications.  MDM - Abdominal pain in early pregnancy with normal intrauterine pregnancy and hemodynamically stable. Suspect pain is due to mild PID (cervical motion tenderness present) versus corpus luteum cyst on the right. Low suspicion for appendicitis or other emergent intra-abdominal process do to complete lack of abdominal tenderness. The patient feels that she is low risk for STDs, but is agreeable to PID treatment. Rx azithromycin. Gets frequent BV--will add Flagyl to treatment. Has had gonorrhea in the past and took Rocephin without allergic reaction.   ASSESSMENT 1. Acute PID (pelvic inflammatory disease)   2. Abdominal pain affecting pregnancy   3. Normal IUP (intrauterine pregnancy) on prenatal ultrasound, first trimester     PLAN Discharge home in stable condition. Appendicitis, PID and first trimester precautions Proceed  with termination or start prenatal care. Follow-up Information    Obstetrician of your choice Follow up.   Why:  start prenatal care       [place] Follow up.   Why:  in emergencies Contact information: [address] [id number] [place] [phone]         Allergies as  of 04/05/2016      Reactions   Penicillins Hives   Has patient had a PCN reaction causing immediate rash, facial/tongue/throat swelling, SOB or lightheadedness with hypotension: No Has patient had a PCN reaction causing severe rash involving mucus membranes or skin necrosis: No Has patient had a PCN reaction that required hospitalization No Has patient had a PCN reaction occurring within the last 10 years: Yes If all of the above answers are "NO", then may proceed with Cephalosporin use-----------Has taken Rocephin w/out reaction.    Orange Juice [orange Oil] Rash      Medication List    TAKE these medications   azithromycin 500 MG tablet Commonly known as:  ZITHROMAX Take 2 tablets (1,000 mg total) by mouth once.   CONCEPT OB 130-92.4-1 MG Caps Take 1 tablet by mouth daily.   metroNIDAZOLE 500 MG tablet Commonly known as:  FLAGYL Take 1 tablet (500 mg total) by mouth once.   Polyethyl Glycol-Propyl Glycol 0.4-0.3 % Soln Commonly known as:  SYSTANE Apply 1 drop to eye 4 (four) times daily as needed.   promethazine 25 MG tablet Commonly known as:  PHENERGAN Take 1 tablet (25 mg total) by mouth every 6 (six) hours as needed.        [place], CNM 04/05/2016  3:22 AM  4

## 2016-04-05 NOTE — Discharge Instructions (Signed)
Abdominal Pain During Pregnancy Abdominal pain is common in pregnancy. Most of the time, it does not cause harm. There are many causes of abdominal pain. Some causes are more serious than others and sometimes the cause is not known. Abdominal pain can be a sign that something is very wrong with the pregnancy or the pain may have nothing to do with the pregnancy. Always tell your health care provider if you have any abdominal pain. Follow these instructions at home:  Do not have sex or put anything in your vagina until your symptoms go away completely.  Watch your abdominal pain for any changes.  Get plenty of rest until your pain improves.  Drink enough fluid to keep your urine clear or pale yellow.  Take over-the-counter or prescription medicines only as told by your health care provider.  Keep all follow-up visits as told by your health care provider. This is important. Contact a health care provider if:  You have a fever.  Your pain gets worse or you have cramping.  Your pain continues after resting. Get help right away if:  You are bleeding, leaking fluid, or passing tissue from the vagina.  You have vomiting or diarrhea that does not go away.  You have painful or bloody urination.  You notice a decrease in your baby's movements.  You feel very weak or faint.  You have shortness of breath.  You develop a severe headache with abdominal pain.  You have abnormal vaginal discharge with abdominal pain. This information is not intended to replace advice given to you by your health care provider. Make sure you discuss any questions you have with your health care provider. Document Released: 01/29/2005 Document Revised: 11/10/2015 Document Reviewed: 08/28/2012 Elsevier Interactive Patient Education  2017 [employer].   Pelvic Inflammatory Disease Introduction Pelvic inflammatory disease (PID) refers to an infection in some or all of the female organs. The infection can be  in the uterus, ovaries, fallopian tubes, or the surrounding tissues in the pelvis. PID can cause abdominal or pelvic pain that comes on suddenly (acute pelvic pain). PID is a serious infection because it can lead to lasting (chronic) pelvic pain or the inability to have children (infertility). What are the causes? This condition is most often caused by an infection that is spread during sexual contact. However, the infection can also be caused by the normal bacteria that are found in the vaginal tissues if these bacteria travel upward into the reproductive organs. PID can also occur following:  The birth of a baby.  A miscarriage.  An abortion.  Major pelvic surgery.  The use of an intrauterine device (IUD).  A sexual assault. What increases the risk? This condition is more likely to develop in women who:  Are younger than 25 years of age.  Are sexually active at [facility] age.  Use nonbarrier contraception.  Have multiple sexual partners.  Have sex with someone who has symptoms of an STD (sexually transmitted disease).  Use oral contraception. At times, certain behaviors can also increase the possibility of getting PID, such as:  Using a vaginal douche.  Having an IUD in place. What are the signs or symptoms? Symptoms of this condition include:  Abdominal or pelvic pain.  Fever.  Chills.  Abnormal vaginal discharge.  Abnormal uterine bleeding.  Unusual pain shortly after the end of a menstrual period.  Painful urination.  Pain with sexual intercourse.  Nausea and vomiting. How is this diagnosed? To diagnose this condition, your health  care provider will do a physical exam and take your medical history. A pelvic exam typically reveals great tenderness in the uterus and the surrounding pelvic tissues. You may also have tests, such as:  Lab tests, including a pregnancy test, blood tests, and urine test.  Culture tests of the vagina and cervix to check for an  STD.  Ultrasound.  A laparoscopic procedure to look inside the pelvis.  Examining vaginal secretions under a microscope. How is this treated? Treatment for this condition may involve one or more approaches.  Antibiotic medicines may be prescribed to be taken by mouth.  Sexual partners may need to be treated if the infection is caused by an STD.  For more severe cases, hospitalization may be needed to give antibiotics directly into a vein through an IV tube.  Surgery may be needed if other treatments do not help, but this is rare. It may take weeks until you are completely well. If you are diagnosed with PID, you should also be checked for human immunodeficiency virus (HIV). Your health care provider may test you for infection again 3 months after treatment. You should not have unprotected sex. Follow these instructions at home:  Take over-the-counter and prescription medicines only as told by your health care provider.  If you were prescribed an antibiotic medicine, take it as told by your health care provider. Do not stop taking the antibiotic even if you start to feel better.  Do not have sexual intercourse until treatment is completed or as told by your health care provider. If PID is confirmed, your recent sexual partners will need treatment, especially if you had unprotected sex.  Keep all follow-up visits as told by your health care provider. This is important. Contact a health care provider if:  You have increased or abnormal vaginal discharge.  Your pain does not improve.  You vomit.  You have a fever.  You cannot tolerate your medicines.  Your partner has an STD.  You have pain when you urinate. Get help right away if:  You have increased abdominal or pelvic pain.  You have chills.  Your symptoms are not better in 72 hours even with treatment. This information is not intended to replace advice given to you by your health care provider. Make sure you discuss  any questions you have with your health care provider. Document Released: 01/29/2005 Document Revised: 07/07/2015 Document Reviewed: 03/08/2014  2017 [employer]

## 2017-02-10 ENCOUNTER — Encounter (HOSPITAL_COMMUNITY): Payer: Self-pay

## 2017-02-10 IMAGING — US US OB TRANSVAGINAL
1 series · 13 of 28 positions shown · non-contrast
Comparison: None.

CLINICAL DATA: Pelvic pain for 6 days, gradually increasing. Pain
radiates from the umbilicus to the right lower quadrant. Early
pregnancy. Estimated gestational age by LMP is 2 weeks 5 days.
Quantitative beta HCG is 563, rising from 12/30/15.

EXAM:
OBSTETRIC <14 WK US AND TRANSVAGINAL OB US
TECHNIQUE: Both transabdominal and transvaginal ultrasound examinations were
performed for complete evaluation of the gestation as well as the
maternal uterus, adnexal regions, and pelvic cul-de-sac.
Transvaginal technique was performed to assess early pregnancy.

[Series 1: us ob transvaginal · 0.25mm/px · 13 of 77 slices shown]
[im 3/77]
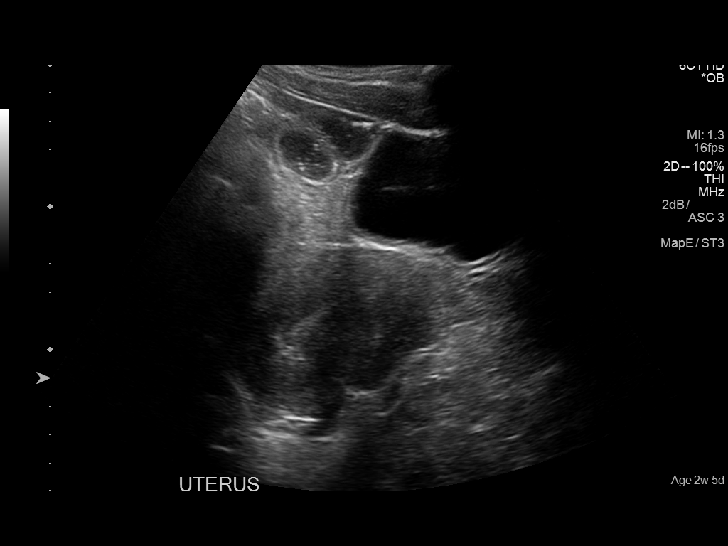
[im 9/77]
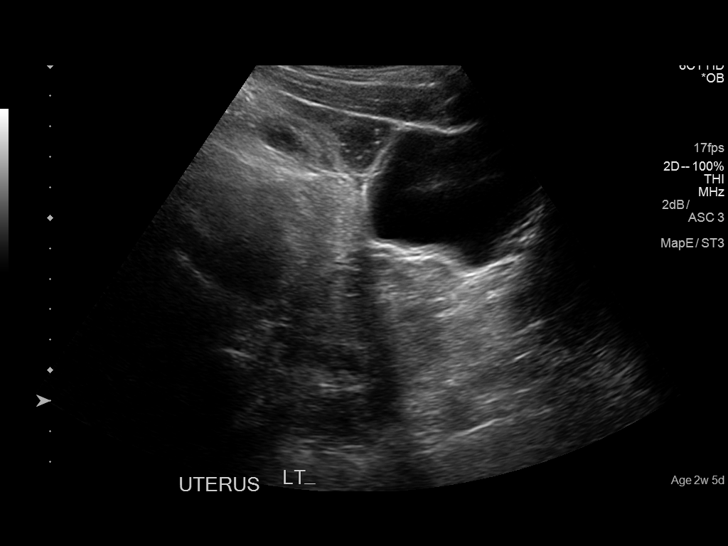
[im 15/77]
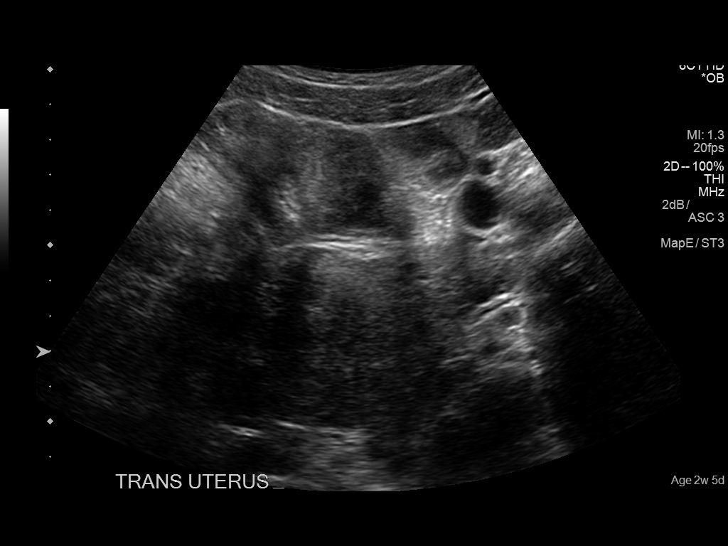
[im 20/77]
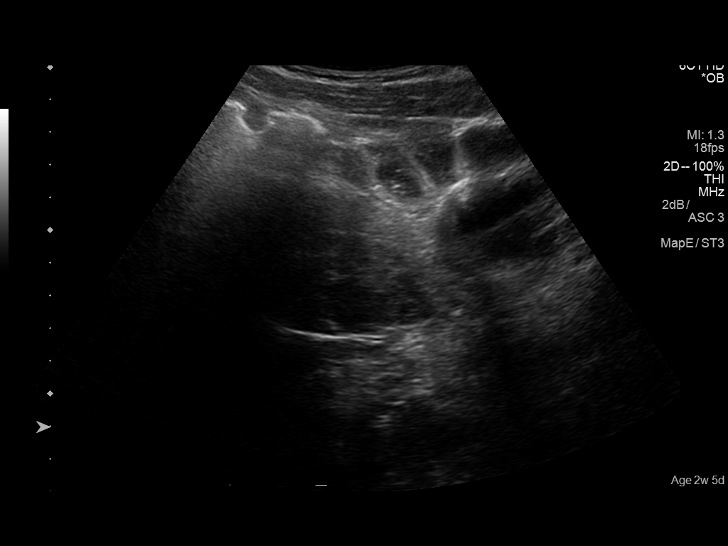
[im 26/77]
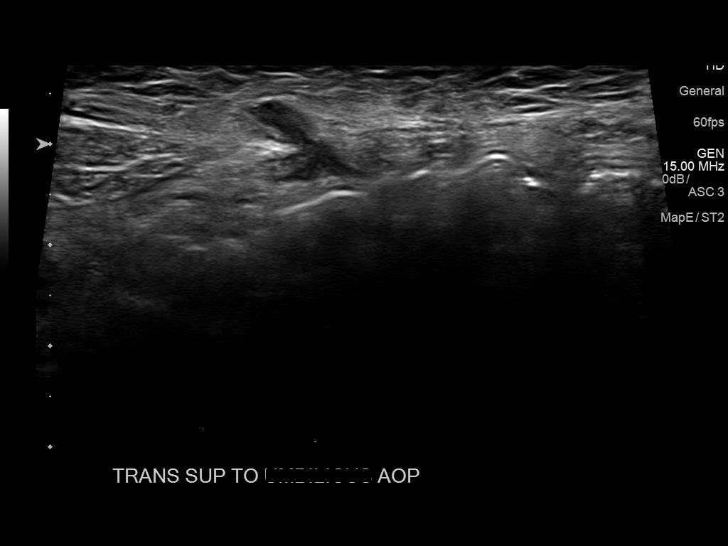
[im 31/77]
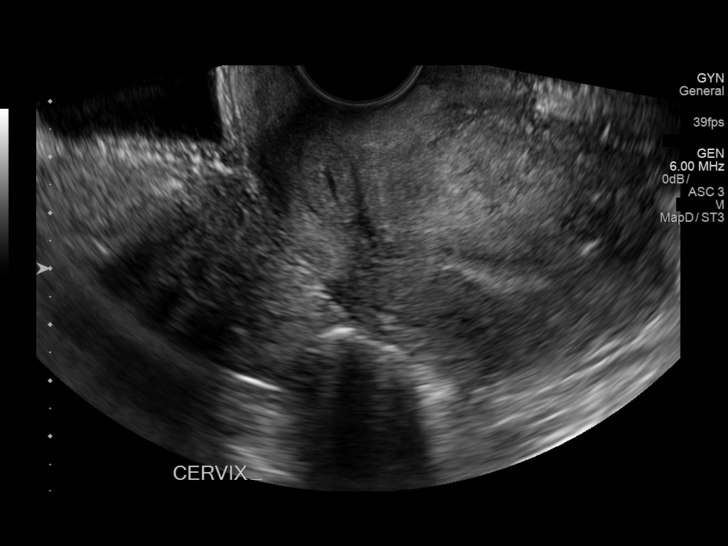
[im 40/77]
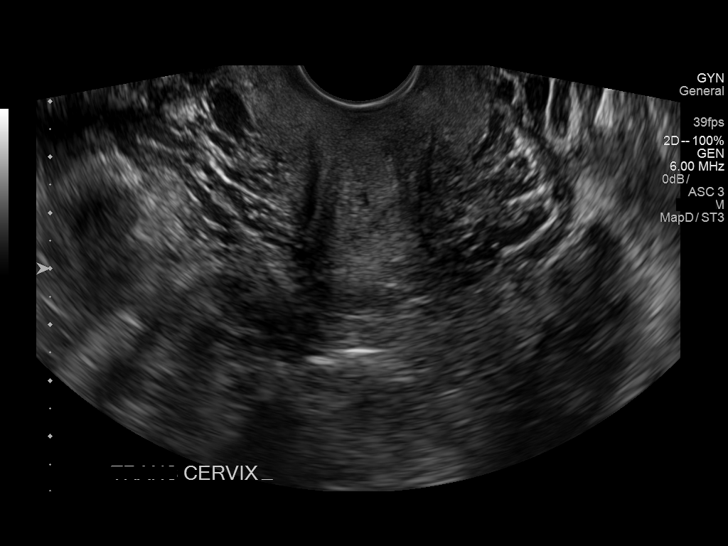
[im 46/77]
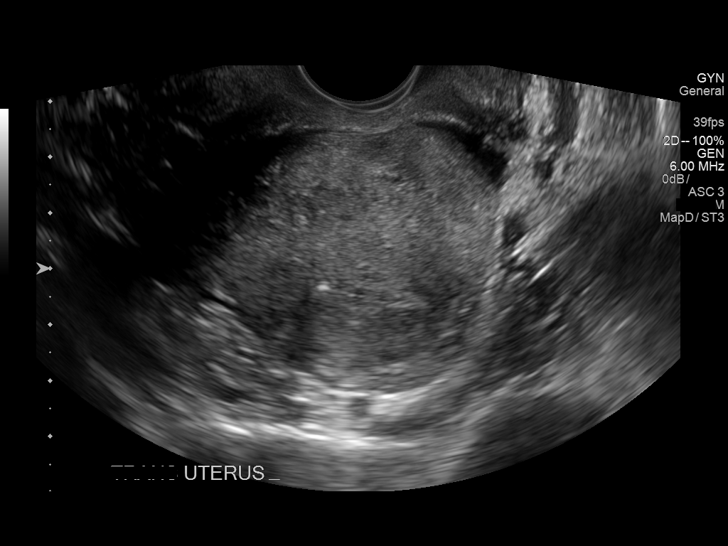
[im 51/77]
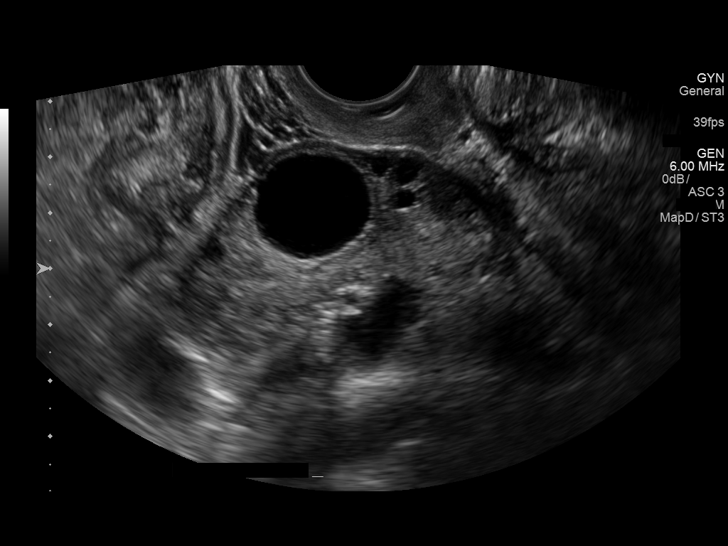
[im 57/77]
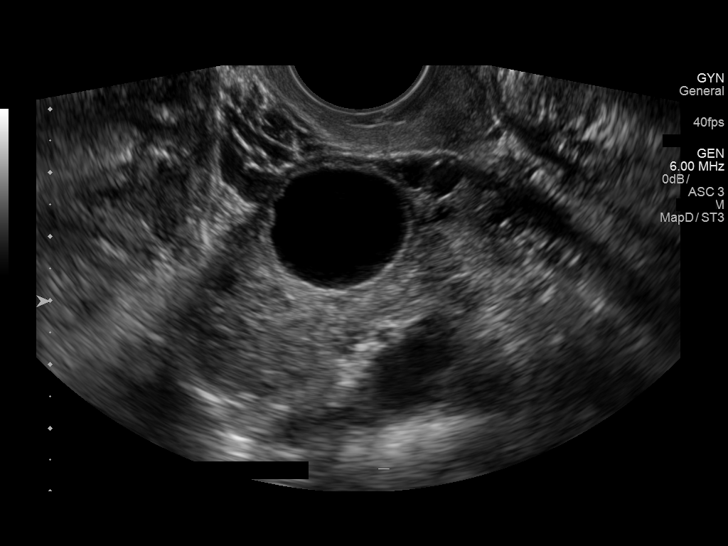
[im 62/77]
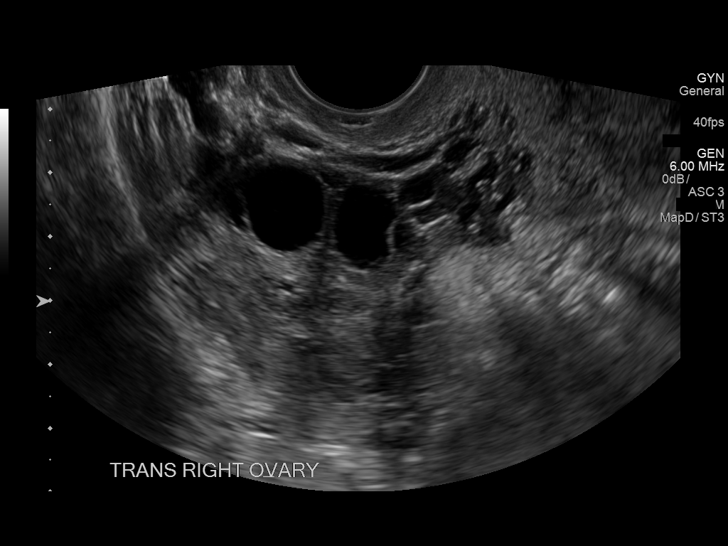
[im 68/77]
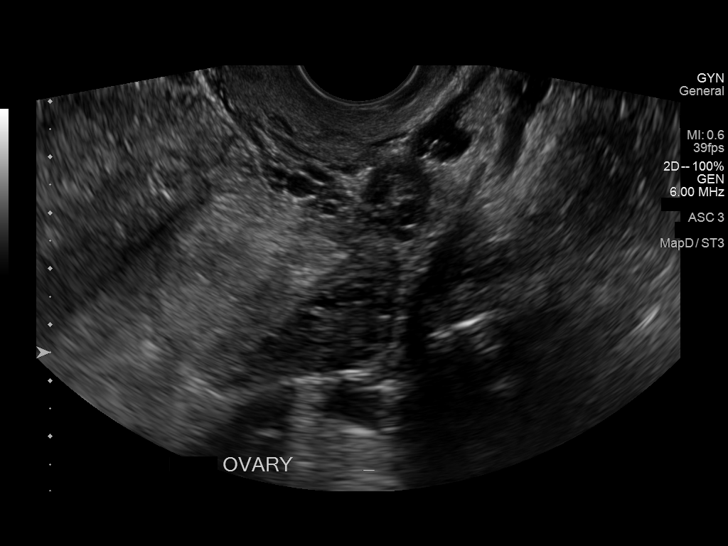
[im 74/77]
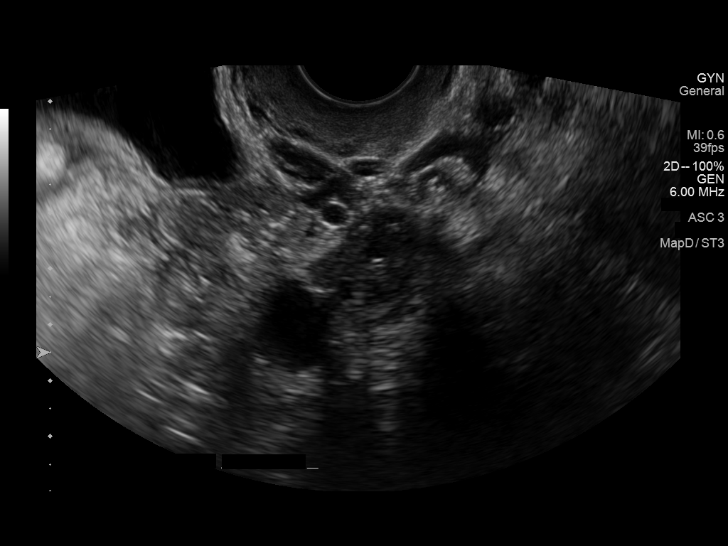

[13 of 28 positions shown; findings below may reference images not displayed]

FINDINGS: Intrauterine gestational sac: Not identified.

Yolk sac:  Not identified.

Embryo:  Not identified.

Cardiac Activity: Not identified.

Maternal uterus/adnexae: Uterus is retroverted. No myometrial mass
lesions identified. Endometrial stripe thickness is normal. Uterus
measures 8.5 x 4.5 x 4.9 cm transabdominal eat. Endometrial stripe
measures 9.4 mm. Right ovary measures 5.8 x 2.6 x 3.3 cm. Left ovary
measures 2.9 x 1.9 x 2.5 cm. No abnormal adnexal masses. Normal
follicular cysts.

Images were obtained at the umbilicus where the pain is localized. A
small fat containing periumbilical hernia was noted during Valsalva.
No bowel herniation.
IMPRESSION: No intrauterine gestational sac, yolk sac, or fetal pole identified.
Differential considerations include intrauterine pregnancy too early
to be sonographically visualized, missed abortion, or ectopic
pregnancy. Followup ultrasound is recommended in 10-14 days for
further evaluation.

Incidental note of a small fat containing periumbilical hernia.

## 2017-07-31 ENCOUNTER — Other Ambulatory Visit: Payer: Self-pay

## 2017-07-31 ENCOUNTER — Encounter (HOSPITAL_COMMUNITY): Payer: Self-pay | Admitting: *Deleted

## 2017-07-31 ENCOUNTER — Observation Stay (HOSPITAL_COMMUNITY)
Admission: EM | Admit: 2017-07-31 | Discharge: 2017-08-01 | Discharge status: Against Medical Advice | Payer: Medicaid Other | Attending: Surgery | Admitting: Surgery

## 2017-07-31 DIAGNOSIS — K8000 Calculus of gallbladder with acute cholecystitis without obstruction: Secondary | ICD-10-CM | POA: Insufficient documentation

## 2017-07-31 DIAGNOSIS — O99611 Diseases of the digestive system complicating pregnancy, first trimester: Principal | ICD-10-CM | POA: Insufficient documentation

## 2017-07-31 DIAGNOSIS — Z3A11 11 weeks gestation of pregnancy: Secondary | ICD-10-CM | POA: Diagnosis not present

## 2017-07-31 DIAGNOSIS — R1011 Right upper quadrant pain: Secondary | ICD-10-CM

## 2017-07-31 DIAGNOSIS — O468X1 Other antepartum hemorrhage, first trimester: Secondary | ICD-10-CM

## 2017-07-31 DIAGNOSIS — K8010 Calculus of gallbladder with chronic cholecystitis without obstruction: Secondary | ICD-10-CM | POA: Diagnosis present

## 2017-07-31 DIAGNOSIS — O26891 Other specified pregnancy related conditions, first trimester: Secondary | ICD-10-CM | POA: Insufficient documentation

## 2017-07-31 DIAGNOSIS — O418X10 Other specified disorders of amniotic fluid and membranes, first trimester, not applicable or unspecified: Secondary | ICD-10-CM

## 2017-07-31 DIAGNOSIS — K819 Cholecystitis, unspecified: Secondary | ICD-10-CM

## 2017-07-31 HISTORY — DX: Crohn's disease, unspecified, without complications: K50.90

## 2017-07-31 NOTE — ED Triage Notes (Signed)
Pt c/o RUQ pain, stated "I'm also pregnant, my doctor put me @ 9 wks.  I have an appt. Monday."

## 2017-07-31 NOTE — ED Provider Notes (Signed)
[facility] DEPT Provider Note   CSN: [id number] Arrival date & time: 07/31/17  2248     History   Chief Complaint Chief Complaint  Patient presents with  . Abdominal Pain    HPI Kaitlyn Howard is a 27 y.o. female.  HPI   27 yo F with PMHx Crohn's, currently estimated [redacted] wk GA, here with abdominal pain. Pt states that for 3-4 days, she's had intermittent aching, stabbing, right sided abd pain with nausea. Pain comes and goes, is severe at onset then goes away. No association w/ eating. No fevers, chills, nausea, or vomiting. No vaginal bleeding, LOF, or spotting. She has a 20 yr old son, one miscarriage after that. No h/o PID. No h/o ectopics. She states the pain is more and more frequent so she is here for evaluation., Has not had an U/S yet.  Past Medical History:  Diagnosis Date  . Crohn disease ([place])   . H/O sickle cell trait     Patient Active Problem List   Diagnosis Date Noted  . Cholecystitis with cholelithiasis 08/01/2017  . Normal IUP (intrauterine pregnancy) on prenatal ultrasound, first trimester 04/05/2016    Past Surgical History:  Procedure Laterality Date  . CESAREAN SECTION       OB History    Gravida  4   Para  1   Term  1   Preterm      AB  1   Living  1     SAB  1   TAB      Ectopic      Multiple      Live Births  1            Home Medications    Prior to Admission medications   Medication Sig Start Date End Date Taking? Authorizing Provider  Prenat w/o A Vit-FeFum-FePo-FA (CONCEPT OB) 130-92.4-1 MG CAPS Take 1 tablet by mouth daily. 04/05/16  Yes Smith, [place], CNM  valACYclovir (VALTREX) 1000 MG tablet Take 1,000 mg by mouth daily.  07/15/17  Yes [provider]  Polyethyl Glycol-Propyl Glycol (SYSTANE) 0.4-0.3 % SOLN Apply 1 drop to eye 4 (four) times daily as needed. Patient not taking: Reported on 07/31/2017 06/13/15   Melynda Ripple, MD  promethazine (PHENERGAN) 25 MG tablet  Take 1 tablet (25 mg total) by mouth every 6 (six) hours as needed. Patient not taking: Reported on 07/31/2017 04/05/16   Manya Silvas, CNM    Family History Family History  Problem Relation Age of Onset  . Hypertension Mother   . Hypertension Father     Social History Social History   Tobacco Use  . Smoking status: Former Smoker    Types: Cigarettes  . Smokeless tobacco: Never Used  Substance Use Topics  . Alcohol use: Not Currently  . Drug use: Not Currently    Types: Marijuana     Allergies   Penicillins and Orange juice [orange oil]   Review of Systems Review of Systems  Constitutional: Negative for chills and fever.  HENT: Negative for congestion, rhinorrhea and sore throat.   Eyes: Negative for visual disturbance.  Respiratory: Negative for cough, shortness of breath and wheezing.   Cardiovascular: Negative for chest pain and leg swelling.  Gastrointestinal: Positive for abdominal pain and nausea. Negative for diarrhea and vomiting.  Genitourinary: Positive for pelvic pain. Negative for dysuria, flank pain, vaginal bleeding and vaginal discharge.  Musculoskeletal: Negative for neck pain.  Skin: Negative for rash.  Allergic/Immunologic: Negative for  immunocompromised state.  Neurological: Negative for syncope and headaches.  Hematological: Does not bruise/bleed easily.  All other systems reviewed and are negative.    Physical Exam Updated Vital Signs BP 115/82 (BP Location: Left Arm)   Pulse 89   Temp 98.3 F (36.8 C) (Oral)   Resp 15   Ht 5' 7"  (1.702 m)   Wt 56.7 kg (125 lb)   SpO2 98%   BMI 19.58 kg/m   Physical Exam  Constitutional: She is oriented to person, place, and time. She appears well-developed and well-nourished. No distress.  HENT:  Head: Normocephalic and atraumatic.  Eyes: Conjunctivae are normal.  Neck: Neck supple.  Cardiovascular: Normal rate, regular rhythm and normal heart sounds. Exam reveals no friction rub.  No murmur  heard. Pulmonary/Chest: Effort normal and breath sounds normal. No respiratory distress. She has no wheezes. She has no rales.  Abdominal: She exhibits no distension. There is tenderness in the right upper quadrant and suprapubic area.  Genitourinary:  Genitourinary Comments: No vaginal bleeding or discharge. No CMT.  Musculoskeletal: She exhibits no edema.  Neurological: She is alert and oriented to person, place, and time. She exhibits normal muscle tone.  Skin: Skin is warm. Capillary refill takes less than 2 seconds.  Psychiatric: She has a normal mood and affect.  Nursing note and vitals reviewed.    ED Treatments / Results  Labs (all labs ordered are listed, but only abnormal results are displayed) Labs Reviewed  WET PREP, GENITAL - Abnormal; Notable for the following components:      Result Value   WBC, Wet Prep HPF POC RARE (*)    All other components within normal limits  COMPREHENSIVE METABOLIC PANEL - Abnormal; Notable for the following components:   Potassium 3.1 (*)    All other components within normal limits  CBC - Abnormal; Notable for the following components:   WBC 14.3 (*)    HCT 35.1 (*)    MCV 75.2 (*)    All other components within normal limits  HCG, QUANTITATIVE, PREGNANCY - Abnormal; Notable for the following components:   hCG, Beta Chain, Quant, S 145,530 (*)    All other components within normal limits  I-STAT BETA HCG BLOOD, ED ([facility], [facility], [facility] ONLY) - Abnormal; Notable for the following components:   I-stat hCG, quantitative >2,000.0 (*)    All other components within normal limits  LIPASE, BLOOD  URINALYSIS, ROUTINE W REFLEX MICROSCOPIC  HIV ANTIBODY (ROUTINE TESTING)  ABO/RH  WET PREP  (BD AFFIRM) ([place])  GC/CHLAMYDIA PROBE AMP ([place]) NOT AT [facility]    EKG None  Radiology US Ob Comp < 14 Wks  Result Date: 08/01/2017 CLINICAL DATA:  Right upper quadrant pain for 2 days. Estimated gestational age by LMP is not indicated. Quantitative  beta HCG is not provided. EXAM: OBSTETRIC <14 WK ULTRASOUND TECHNIQUE: Transabdominal ultrasound was performed for evaluation of the gestation as well as the maternal uterus and adnexal regions. COMPARISON:  None. FINDINGS: Intrauterine gestational sac: A single intrauterine pregnancy is identified. Yolk sac:  Yolk sac is present. Embryo:  The fetal pole is identified. Cardiac Activity: Fetal cardiac activity is observed. Heart Rate: 177 bpm CRL: 24 mm   9 w 1 d                  [place] EDC: 03/05/2018 Subchorionic hemorrhage: A small subchorionic hemorrhage is identified anteriorly. Maternal uterus/adnexae: Uterus is retroflexed. No myometrial mass lesions identified. Both ovaries are visualized and  appear normal with normal follicular changes noted. No abnormal adnexal masses. No free fluid in the pelvis. IMPRESSION: Single intrauterine pregnancy is identified. Estimated gestational age by crown-rump length is 9 weeks 1 day. A small subchorionic hemorrhage is identified. Electronically Signed   By: Lucienne Capers M.D.   On: 08/01/2017 02:37   US Abdomen Limited Ruq  Result Date: 08/01/2017 CLINICAL DATA:  Right upper quadrant pain for 2 days. EXAM: ULTRASOUND ABDOMEN LIMITED RIGHT UPPER QUADRANT COMPARISON:  None. FINDINGS: Gallbladder: Cholelithiasis with large stone in the gallbladder measuring 2.1 cm in maximal diameter. Gallbladder is to contracted with diffuse gallbladder wall thickening. Murphy's sign is positive. Common bile duct: Diameter: 3.9 mm, normal Liver: No focal lesion identified. Within normal limits in parenchymal echogenicity. Portal vein is patent on color Doppler imaging with normal direction of blood flow towards the liver. Incidental note of large amount of fluid and debris within the stomach, likely representing ingested contents. IMPRESSION: Cholelithiasis with gallbladder wall thickening and positive Murphy's sign. Changes are consistent with acute cholecystitis in the appropriate  clinical setting. Electronically Signed   By: Lucienne Capers M.D.   On: 08/01/2017 02:28    Procedures Procedures (including critical care time)  Medications Ordered in ED Medications  sodium chloride 0.9 % bolus 1,000 mL (has no administration in time range)  cefTRIAXone (ROCEPHIN) 2 g in sodium chloride 0.9 % 100 mL IVPB (has no administration in time range)     Initial Impression / Assessment and Plan / ED Course  I have reviewed the triage vital signs and the nursing notes.  Pertinent labs & imaging results that were available during my care of the patient were reviewed by me and considered in my medical decision making (see chart for details).     27 yo F here with intermittent R-sided, primarily RUQ pain. On exam, her exam is more c/w possible GB disease with + Murphy's. LFTs, lipase normal. Pt has mild leukocytosis. U/S obtained and shows IUP, small subchorion hemorrhage w/o other complications. RUQ u/s shows + cholecystitis. Will consult [facility]. Dr. Ninfa Linden to admit.  ADDENDUM: While awaiting admission, pt reportedly became very angry that she could not go to her car to get her phone charger (IV in place). Pt became belligerent towards staff despite offering to walk her out to car w/ security. She then eloped after signing [place] paperwork.  Prior to this (prior to admission), I had a very long discussion with pt that she would need surgical management for her cholecystitis. She requested to go home but I discussed that without appropriate treatment, her cholecystitis could lead to sepsis, perforation, and subsequent death for both her and her fetus. I discussed that untreated cholecystitis would very likely result in pregnancy loss and severe illness for her. She was able to express full understanding at that time and had then agreed to stay for surgical consultation and admission, but reportedly then became very agitated while requesting a phone charger as above.  Final Clinical  Impressions(s) / ED Diagnoses   Final diagnoses:  RUQ pain  Cholecystitis  Subchorionic hemorrhage of placenta in first trimester, single or unspecified fetus    ED Discharge Orders    None       Duffy Bruce, MD 08/01/17 [phone]

## 2017-08-01 ENCOUNTER — Emergency Department (HOSPITAL_COMMUNITY): Payer: Medicaid Other

## 2017-08-01 DIAGNOSIS — K8010 Calculus of gallbladder with chronic cholecystitis without obstruction: Secondary | ICD-10-CM

## 2017-08-01 HISTORY — DX: Calculus of gallbladder with chronic cholecystitis without obstruction: K80.10

## 2017-08-01 LAB — GC/CHLAMYDIA PROBE AMP (~~LOC~~) NOT AT ARMC
CHLAMYDIA, DNA PROBE: NEGATIVE
Neisseria Gonorrhea: NEGATIVE

## 2017-08-01 LAB — URINALYSIS, ROUTINE W REFLEX MICROSCOPIC
BILIRUBIN URINE: NEGATIVE
Glucose, UA: NEGATIVE mg/dL
HGB URINE DIPSTICK: NEGATIVE
Ketones, ur: NEGATIVE mg/dL
Leukocytes, UA: NEGATIVE
Nitrite: NEGATIVE
PH: 6 (ref 5.0–8.0)
Protein, ur: NEGATIVE mg/dL
SPECIFIC GRAVITY, URINE: 1.011 (ref 1.005–1.030)

## 2017-08-01 LAB — WET PREP, GENITAL
Clue Cells Wet Prep HPF POC: NONE SEEN
Sperm: NONE SEEN
Trich, Wet Prep: NONE SEEN
YEAST WET PREP: NONE SEEN

## 2017-08-01 LAB — COMPREHENSIVE METABOLIC PANEL
ALBUMIN: 3.8 g/dL (ref 3.5–5.0)
ALK PHOS: 45 U/L (ref 38–126)
ALT: 17 U/L (ref 14–54)
ANION GAP: 7 (ref 5–15)
AST: 20 U/L (ref 15–41)
BUN: 7 mg/dL (ref 6–20)
CALCIUM: 9.2 mg/dL (ref 8.9–10.3)
CO2: 25 mmol/L (ref 22–32)
Chloride: 105 mmol/L (ref 101–111)
Creatinine, Ser: 0.54 mg/dL (ref 0.44–1.00)
GFR calc Af Amer: >60 mL/min (ref >60)
GFR calc non Af Amer: >60 mL/min (ref >60)
GLUCOSE: 89 mg/dL (ref 65–99)
Potassium: 3.1 mmol/L — ABNORMAL LOW (ref 3.5–5.1)
SODIUM: 137 mmol/L (ref 135–145)
Total Bilirubin: 0.4 mg/dL (ref 0.3–1.2)
Total Protein: 6.8 g/dL (ref 6.5–8.1)

## 2017-08-01 LAB — HCG, QUANTITATIVE, PREGNANCY: hCG, Beta Chain, Quant, S: 145530 m[IU]/mL — ABNORMAL HIGH (ref <5)

## 2017-08-01 LAB — I-STAT BETA HCG BLOOD, ED (MC, WL, AP ONLY): I-stat hCG, quantitative: >2000 m[IU]/mL — ABNORMAL HIGH (ref <5)

## 2017-08-01 LAB — HIV ANTIBODY (ROUTINE TESTING W REFLEX): HIV SCREEN 4TH GENERATION: NONREACTIVE

## 2017-08-01 LAB — CBC
HEMATOCRIT: 35.1 % — AB (ref 36.0–46.0)
HEMOGLOBIN: 12.5 g/dL (ref 12.0–15.0)
MCH: 26.8 pg (ref 26.0–34.0)
MCHC: 35.6 g/dL (ref 30.0–36.0)
MCV: 75.2 fL — ABNORMAL LOW (ref 78.0–100.0)
Platelets: 207 10*3/uL (ref 150–400)
RBC: 4.67 MIL/uL (ref 3.87–5.11)
RDW: 14.8 % (ref 11.5–15.5)
WBC: 14.3 10*3/uL — ABNORMAL HIGH (ref 4.0–10.5)

## 2017-08-01 LAB — LIPASE, BLOOD: Lipase: 26 U/L (ref 11–51)

## 2017-08-01 LAB — ABO/RH: ABO/RH(D): A POS

## 2017-08-01 MED ORDER — SODIUM CHLORIDE 0.9 % IV BOLUS: 1000.0000 mL | Freq: Once | INTRAVENOUS | Status: DC

## 2017-08-01 MED ORDER — SODIUM CHLORIDE 0.9 % IV SOLN
2.0000 g | Freq: Once | INTRAVENOUS | Status: DC
  Filled 2017-08-01: qty 20

## 2017-08-01 NOTE — ED Provider Notes (Addendum)
As per my note, I was not notified until patient left AMA. I attempted to call patient x 3 overnight and have left a voicemail at the number provided encouraging pt to return, again reiterating high risk of decompensation and death to herself and her child if untreated. Also encouraged her to present to any ED if she would prefer treatment elsewhere.   Duffy Bruce, MD 08/01/17 1410    Duffy Bruce, MD 08/01/17 [phone]

## 2017-08-01 NOTE — ED Notes (Signed)
The OR is aware that the patient left AMA, Eddie Dibbles stated that he would notify Dr Ninfa Linden and Dr Barry Dienes

## 2017-08-01 NOTE — ED Notes (Addendum)
Pt began requesting ultrasound at 0130. Physician explained to pt that once results came back, further care would be discussed. Results showed that pt had gallstones and would require surgery. Pt became upset and told this nurse she needed to get her phone charger so she could inform family that she would be going to surgery. Staff attempted to find spare charger within unit. Pt insisted on getting her personal charger from her car. Pt continued to escalate, yelling at staff demanding to get her charger from her car. Pt observed walking to her car by security to retrieve her charger and back to her room. When pt came back to her room, she continued yelling "If you aren't my nurse or the doctor, do not step in my room." Pt then attempted to slam door in face of security. Pt continued yelling and then demanded to have her IV taken out and stated "I'm just going to go somewhere else. Take this out of my arm." Nurse removed IV and had pt sign out AMA. Pt refused proper dressing of IV site after removal. MD notified that pt left

## 2017-08-05 LAB — CYTOLOGY - PAP: Pap: NEGATIVE

## 2017-08-19 LAB — OB RESULTS CONSOLE HEPATITIS B SURFACE ANTIGEN: HEP B S AG: NEGATIVE

## 2017-08-19 LAB — OB RESULTS CONSOLE ABO/RH: RH Type: POSITIVE

## 2017-08-19 LAB — OB RESULTS CONSOLE ANTIBODY SCREEN: Antibody Screen: NEGATIVE

## 2017-08-19 LAB — OB RESULTS CONSOLE GC/CHLAMYDIA
Chlamydia: NEGATIVE
Gonorrhea: NEGATIVE

## 2017-08-19 LAB — OB RESULTS CONSOLE HIV ANTIBODY (ROUTINE TESTING): HIV: NONREACTIVE

## 2017-08-19 LAB — OB RESULTS CONSOLE RPR: RPR: NONREACTIVE

## 2017-08-19 LAB — OB RESULTS CONSOLE RUBELLA ANTIBODY, IGM: Rubella: IMMUNE

## 2018-01-21 ENCOUNTER — Other Ambulatory Visit: Payer: Self-pay | Admitting: Obstetrics and Gynecology

## 2018-01-23 ENCOUNTER — Other Ambulatory Visit (HOSPITAL_COMMUNITY): Payer: Self-pay | Admitting: Maternal and Fetal Medicine

## 2018-01-23 ENCOUNTER — Other Ambulatory Visit (HOSPITAL_COMMUNITY): Payer: Self-pay | Admitting: *Deleted

## 2018-01-23 ENCOUNTER — Ambulatory Visit (HOSPITAL_COMMUNITY)
Admission: RE | Admit: 2018-01-23 | Discharge: 2018-01-23 | Disposition: A | Discharge status: Home / Self Care | Payer: Medicaid Other | Source: Ambulatory Visit | Attending: Obstetrics and Gynecology | Admitting: Obstetrics and Gynecology

## 2018-01-23 ENCOUNTER — Other Ambulatory Visit: Payer: Self-pay

## 2018-01-23 ENCOUNTER — Encounter (HOSPITAL_COMMUNITY): Payer: Self-pay | Admitting: *Deleted

## 2018-01-23 DIAGNOSIS — Z3A34 34 weeks gestation of pregnancy: Secondary | ICD-10-CM | POA: Insufficient documentation

## 2018-01-23 DIAGNOSIS — Z363 Encounter for antenatal screening for malformations: Secondary | ICD-10-CM

## 2018-01-23 DIAGNOSIS — O34219 Maternal care for unspecified type scar from previous cesarean delivery: Secondary | ICD-10-CM | POA: Insufficient documentation

## 2018-01-23 DIAGNOSIS — Z862 Personal history of diseases of the blood and blood-forming organs and certain disorders involving the immune mechanism: Secondary | ICD-10-CM | POA: Diagnosis not present

## 2018-01-23 DIAGNOSIS — O09293 Supervision of pregnancy with other poor reproductive or obstetric history, third trimester: Secondary | ICD-10-CM

## 2018-01-23 DIAGNOSIS — O365930 Maternal care for other known or suspected poor fetal growth, third trimester, not applicable or unspecified: Secondary | ICD-10-CM | POA: Insufficient documentation

## 2018-01-23 DIAGNOSIS — O365990 Maternal care for other known or suspected poor fetal growth, unspecified trimester, not applicable or unspecified: Secondary | ICD-10-CM

## 2018-01-23 HISTORY — DX: Essential (primary) hypertension: I10

## 2018-01-23 HISTORY — DX: Herpesviral infection, unspecified: B00.9

## 2018-01-23 NOTE — Consult Note (Signed)
Kaitlyn Howard was seen for a high risk pregnancy consultation due to intrauterine growth restriction(IUGR) observed at an outside ultrasound laboratory.   She is a 27 year old [id number] at  75 weeks and 1 days gestational age.   Her obstetric and gynecology history is as follows: 2011- Term cesarean section delivery due to suspected placenta abruption. This pregnancy was also complicated by preeclampsia. 2017- Spontaneous abortion at [redacted] weeks gestational age. 2017- Voluntary termination of pregnancy at [redacted] weeks gestational age.   In the late first trimester period of this pregnancy she was diagnosed with Crohn's disease following a colonoscopy. However, Crohn's disease symptomatology has been observed for the last 5 years prior to GI diagnosis. Flare is characterized by abdominal pain and hematochezia. She was commenced on Prednisone, which she self discontinued by patient after 1 week of treatment. Frequency of flares have significantly as pregnancy has progresses.  No other significant past medical and or surgical history is reported. She only quit smoking cigarettes and marijuana 1 month ago,but denies alcohol intake or use of illicit drugs. It should be noted that paternity of this pregnancy is uncertain. 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 However, her previous partner(who is potentially could be the FOB) is a carrier of sickle trait.  Food and drug allergy to [employer] juice(facial rash) and penicillin(hives) is reported.  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  Ultrasound performed today revealed a live fetus weighted at <10th percentile for gestational age(abdominal circumference is at the 3rd percentile). Umbilical artery Doppler velocimetry pulsatility index is elevated with a BPP score of 8/8.  Prenatal issues noted during this consultation were addressed as follows: Fetal growth restriction We discussed Fetal growth  restriction (FGR, also called intrauterine growth restriction [IUGR]) is the term used to describe a fetus that has not reached its growth potential because of genetic or environmental factors. The origin may be fetal, placental, or maternal, with significant overlap among these entities. The most common sonography-based definition of FGR is a weight below the 10th percentile for gestational age, although other definitions employing a variety of criteria have been advocated. When a small fetus is detected, it can be difficult to distinguish between the fetus that is constitutionally small versus growth restricted. As many as 26 percent of fetuses who are estimated to weigh below the 10th percentile for gestational age are small simply due to constitutional factors (maternal ethnicity, parents stature, parity, or body mass index) they are not at high risk of perinatal mortality and morbidity.We discussed that today her fetus shows no signs of hypoxia or fetal distress. We reviewed the significance of a elevated umbilical artery Doppler velocimetry, which correlates with increased placenta resistance. Early term delivery is reasonable if umbilical artery flow is decreased and risk factors for, or signs of, uteroplacental insufficiency are present, such as oligohydramnios, preeclampsia or hypertension, renal insufficiency, fetal growth arrest, estimated weight <5th percentile, decreased cerebroplacental ratio.  In order to optimize outcome, the following is recommended: - Weekly MFM ultrasound evaluation until delivery. - Weekly clinical evaluation by OB provider. - Maternal fetal surveillance by fetal kick counts- anticipatory guidance provided. - Delivery at 37 week unless otherwise indicated as pregnancy progresses.  Smoking in pregnancy I used this opportunity to counsel Kaitlyn Howard about  smoking in pregnancy and the advantages of smoke cessation. Smoking in pregnancy has been associated with preterm  prelabor rupture of membranes/preterm labor/preterm birth, placenta abruption,  and fetal growth restriction and its potential adverse outcomes. In postnatal life, neonates/infants exposed to cigarette smoke are at risk of sudden infant death syndrome (SIDS), chronic middle ear infection and childhood obesity as well as childhood asthma. Smoke cessation is recommended.  Marijuana use in pregnancy Chronic marijuana use during pregnancy can result in low birth weight, although a confounding factor could be concomitant tobacco use and poor nutrition. However, the medical complications of tobacco use can also be seen in patients who chronically use marijuana. Most studies have demonstrated increased frequency of NICU admission and preterm birth in fetuses exposed to chronic marijuana smoke. Some studies have discovered that neonates exposed prenatally to heavy maternal marijuana use have an exaggerated startle response, tremors, a high-pitched cry and poor adjustment to visual stimuli. These children(<59 years old) have also been observed to score lower on tests of verbal ability than children of non-users. The aforementioned was discussed with Kaitlyn Howard.   Kaitlyn Howard verbalized understanding of our discussions and recommended plan of care as documented..  Follow up scheduled for 1 week.  I spent 100% of ~30 minutes in face to face time with this patient.  Pricilla Handler MD MFM

## 2018-01-31 ENCOUNTER — Encounter (HOSPITAL_COMMUNITY): Payer: Self-pay

## 2018-01-31 ENCOUNTER — Ambulatory Visit (HOSPITAL_COMMUNITY)
Admission: RE | Admit: 2018-01-31 | Discharge: 2018-01-31 | Disposition: A | Discharge status: Home / Self Care | Payer: Medicaid Other | Source: Ambulatory Visit | Attending: Obstetrics and Gynecology | Admitting: Obstetrics and Gynecology

## 2018-01-31 DIAGNOSIS — O34219 Maternal care for unspecified type scar from previous cesarean delivery: Secondary | ICD-10-CM | POA: Diagnosis not present

## 2018-01-31 DIAGNOSIS — Z3A35 35 weeks gestation of pregnancy: Secondary | ICD-10-CM | POA: Diagnosis not present

## 2018-01-31 DIAGNOSIS — O99333 Smoking (tobacco) complicating pregnancy, third trimester: Secondary | ICD-10-CM

## 2018-01-31 DIAGNOSIS — O09293 Supervision of pregnancy with other poor reproductive or obstetric history, third trimester: Secondary | ICD-10-CM | POA: Diagnosis not present

## 2018-01-31 DIAGNOSIS — O365930 Maternal care for other known or suspected poor fetal growth, third trimester, not applicable or unspecified: Secondary | ICD-10-CM | POA: Insufficient documentation

## 2018-01-31 DIAGNOSIS — O365990 Maternal care for other known or suspected poor fetal growth, unspecified trimester, not applicable or unspecified: Secondary | ICD-10-CM

## 2018-02-07 ENCOUNTER — Encounter (HOSPITAL_COMMUNITY): Payer: Self-pay | Admitting: *Deleted

## 2018-02-07 ENCOUNTER — Ambulatory Visit (HOSPITAL_COMMUNITY)
Admission: RE | Admit: 2018-02-07 | Discharge: 2018-02-07 | Disposition: A | Discharge status: Home / Self Care | Payer: Medicaid Other | Source: Ambulatory Visit | Attending: Obstetrics and Gynecology | Admitting: Obstetrics and Gynecology

## 2018-02-07 ENCOUNTER — Inpatient Hospital Stay (HOSPITAL_COMMUNITY): Payer: Medicaid Other | Admitting: Anesthesiology

## 2018-02-07 ENCOUNTER — Other Ambulatory Visit: Payer: Self-pay

## 2018-02-07 ENCOUNTER — Ambulatory Visit (HOSPITAL_BASED_OUTPATIENT_CLINIC_OR_DEPARTMENT_OTHER)
Admission: RE | Admit: 2018-02-07 | Discharge: 2018-02-07 | Disposition: A | Discharge status: Home / Self Care | Payer: Medicaid Other | Source: Ambulatory Visit | Attending: Obstetrics and Gynecology | Admitting: Obstetrics and Gynecology

## 2018-02-07 ENCOUNTER — Inpatient Hospital Stay (HOSPITAL_COMMUNITY)
Admission: RE | Admit: 2018-02-07 | Discharge: 2018-02-11 | DRG: 787 | Disposition: A | Discharge status: Home / Self Care | Payer: Medicaid Other | Attending: Obstetrics | Admitting: Obstetrics

## 2018-02-07 ENCOUNTER — Encounter (HOSPITAL_COMMUNITY)
Admission: RE | Disposition: A | Discharge status: Home / Self Care | Payer: Self-pay | Source: Home / Self Care | Attending: Obstetrics

## 2018-02-07 ENCOUNTER — Other Ambulatory Visit (HOSPITAL_COMMUNITY): Payer: Self-pay | Admitting: Maternal and Fetal Medicine

## 2018-02-07 ENCOUNTER — Encounter (HOSPITAL_COMMUNITY): Payer: Self-pay

## 2018-02-07 DIAGNOSIS — O365990 Maternal care for other known or suspected poor fetal growth, unspecified trimester, not applicable or unspecified: Secondary | ICD-10-CM

## 2018-02-07 DIAGNOSIS — O34219 Maternal care for unspecified type scar from previous cesarean delivery: Secondary | ICD-10-CM

## 2018-02-07 DIAGNOSIS — O99333 Smoking (tobacco) complicating pregnancy, third trimester: Secondary | ICD-10-CM | POA: Diagnosis not present

## 2018-02-07 DIAGNOSIS — Z87891 Personal history of nicotine dependence: Secondary | ICD-10-CM

## 2018-02-07 DIAGNOSIS — O9962 Diseases of the digestive system complicating childbirth: Secondary | ICD-10-CM | POA: Diagnosis present

## 2018-02-07 DIAGNOSIS — O365930 Maternal care for other known or suspected poor fetal growth, third trimester, not applicable or unspecified: Secondary | ICD-10-CM

## 2018-02-07 DIAGNOSIS — O365911 Maternal care for other known or suspected poor fetal growth, first trimester, fetus 1: Secondary | ICD-10-CM | POA: Diagnosis present

## 2018-02-07 DIAGNOSIS — Z8759 Personal history of other complications of pregnancy, childbirth and the puerperium: Secondary | ICD-10-CM | POA: Diagnosis present

## 2018-02-07 DIAGNOSIS — O9902 Anemia complicating childbirth: Secondary | ICD-10-CM | POA: Diagnosis present

## 2018-02-07 DIAGNOSIS — O34211 Maternal care for low transverse scar from previous cesarean delivery: Principal | ICD-10-CM | POA: Diagnosis present

## 2018-02-07 DIAGNOSIS — D573 Sickle-cell trait: Secondary | ICD-10-CM | POA: Diagnosis present

## 2018-02-07 DIAGNOSIS — Z3A36 36 weeks gestation of pregnancy: Secondary | ICD-10-CM | POA: Insufficient documentation

## 2018-02-07 DIAGNOSIS — O2693 Pregnancy related conditions, unspecified, third trimester: Secondary | ICD-10-CM

## 2018-02-07 DIAGNOSIS — O09293 Supervision of pregnancy with other poor reproductive or obstetric history, third trimester: Secondary | ICD-10-CM

## 2018-02-07 DIAGNOSIS — K5090 Crohn's disease, unspecified, without complications: Secondary | ICD-10-CM | POA: Diagnosis present

## 2018-02-07 HISTORY — DX: Unspecified convulsions: R56.9

## 2018-02-07 LAB — CBC
HCT: 40.3 % (ref 36.0–46.0)
Hemoglobin: 14 g/dL (ref 12.0–15.0)
MCH: 27.4 pg (ref 26.0–34.0)
MCHC: 34.7 g/dL (ref 30.0–36.0)
MCV: 78.9 fL — ABNORMAL LOW (ref 80.0–100.0)
Platelets: 201 10*3/uL (ref 150–400)
RBC: 5.11 MIL/uL (ref 3.87–5.11)
RDW: 14.5 % (ref 11.5–15.5)
WBC: 12.6 10*3/uL — ABNORMAL HIGH (ref 4.0–10.5)
nRBC: 0 % (ref 0.0–0.2)

## 2018-02-07 LAB — TYPE AND SCREEN
ABO/RH(D): A POS
Antibody Screen: NEGATIVE

## 2018-02-07 LAB — ABO/RH: ABO/RH(D): A POS

## 2018-02-07 SURGERY — Surgical Case
Anesthesia: Spinal | Wound class: Clean Contaminated

## 2018-02-07 MED ORDER — LACTATED RINGERS IV SOLN: INTRAVENOUS | Status: DC

## 2018-02-07 MED ORDER — NALOXONE HCL 0.4 MG/ML IJ SOLN: 0.4000 mg | INTRAMUSCULAR | Status: DC | PRN

## 2018-02-07 MED ORDER — LACTATED RINGERS IV SOLN
INTRAVENOUS | Status: DC | PRN
  Administered 2018-02-07: 12:00:00 via INTRAVENOUS

## 2018-02-07 MED ORDER — ACETAMINOPHEN 500 MG PO TABS
1000.0000 mg | ORAL_TABLET | Freq: Four times a day (QID) | ORAL | Status: DC
  Administered 2018-02-07 – 2018-02-11 (×12): 1000 mg via ORAL
  Filled 2018-02-07 (×14): qty 2

## 2018-02-07 MED ORDER — DIBUCAINE 1 % RE OINT: 1.0000 "application " | TOPICAL_OINTMENT | RECTAL | Status: DC | PRN

## 2018-02-07 MED ORDER — DEXAMETHASONE SODIUM PHOSPHATE 10 MG/ML IJ SOLN
INTRAMUSCULAR | Status: DC | PRN
  Administered 2018-02-07: 10 mg via INTRAVENOUS

## 2018-02-07 MED ORDER — LACTATED RINGERS IV SOLN
INTRAVENOUS | Status: DC
  Administered 2018-02-07 (×3): via INTRAVENOUS

## 2018-02-07 MED ORDER — PHENYLEPHRINE 8 MG IN D5W 100 ML (0.08MG/ML) PREMIX OPTIME
INJECTION | INTRAVENOUS | Status: DC | PRN
  Administered 2018-02-07: 60 ug/min via INTRAVENOUS

## 2018-02-07 MED ORDER — MORPHINE SULFATE (PF) 0.5 MG/ML IJ SOLN
INTRAMUSCULAR | Status: DC | PRN
  Administered 2018-02-07: 150 mg via EPIDURAL

## 2018-02-07 MED ORDER — SODIUM CHLORIDE 0.9% FLUSH: 3.0000 mL | INTRAVENOUS | Status: DC | PRN

## 2018-02-07 MED ORDER — WITCH HAZEL-GLYCERIN EX PADS: 1.0000 "application " | MEDICATED_PAD | CUTANEOUS | Status: DC | PRN

## 2018-02-07 MED ORDER — MENTHOL 3 MG MT LOZG: 1.0000 | LOZENGE | OROMUCOSAL | Status: DC | PRN

## 2018-02-07 MED ORDER — FAMOTIDINE IN NACL 20-0.9 MG/50ML-% IV SOLN
20.0000 mg | Freq: Once | INTRAVENOUS | Status: AC
  Administered 2018-02-07: 20 mg via INTRAVENOUS

## 2018-02-07 MED ORDER — PHENYLEPHRINE 8 MG IN D5W 100 ML (0.08MG/ML) PREMIX OPTIME
INJECTION | INTRAVENOUS | Status: AC
  Filled 2018-02-07: qty 100

## 2018-02-07 MED ORDER — SIMETHICONE 80 MG PO CHEW: 80.0000 mg | CHEWABLE_TABLET | ORAL | Status: DC | PRN

## 2018-02-07 MED ORDER — OXYCODONE HCL 5 MG PO TABS
5.0000 mg | ORAL_TABLET | ORAL | Status: DC | PRN
  Administered 2018-02-08: 5 mg via ORAL
  Administered 2018-02-08 – 2018-02-11 (×5): 10 mg via ORAL
  Filled 2018-02-07 (×5): qty 2
  Filled 2018-02-07: qty 1

## 2018-02-07 MED ORDER — NALBUPHINE HCL 10 MG/ML IJ SOLN: 5.0000 mg | Freq: Once | INTRAMUSCULAR | Status: DC | PRN

## 2018-02-07 MED ORDER — SOD CITRATE-CITRIC ACID 500-334 MG/5ML PO SOLN
30.0000 mL | Freq: Once | ORAL | Status: AC
  Administered 2018-02-07: 30 mL via ORAL

## 2018-02-07 MED ORDER — OXYTOCIN 10 UNIT/ML IJ SOLN
INTRAMUSCULAR | Status: AC
  Filled 2018-02-07: qty 4

## 2018-02-07 MED ORDER — DIPHENHYDRAMINE HCL 25 MG PO CAPS: 25.0000 mg | ORAL_CAPSULE | ORAL | Status: DC | PRN

## 2018-02-07 MED ORDER — NALBUPHINE HCL 10 MG/ML IJ SOLN: 5.0000 mg | INTRAMUSCULAR | Status: DC | PRN

## 2018-02-07 MED ORDER — SENNOSIDES-DOCUSATE SODIUM 8.6-50 MG PO TABS
2.0000 | ORAL_TABLET | ORAL | Status: DC
  Administered 2018-02-07 – 2018-02-10 (×4): 2 via ORAL
  Filled 2018-02-07 (×4): qty 2

## 2018-02-07 MED ORDER — GENTAMICIN SULFATE 40 MG/ML IJ SOLN
INTRAVENOUS | Status: AC
  Administered 2018-02-07: 283.5 mg via INTRAVENOUS
  Filled 2018-02-07: qty 7

## 2018-02-07 MED ORDER — BUPIVACAINE IN DEXTROSE 0.75-8.25 % IT SOLN
INTRATHECAL | Status: DC | PRN
  Administered 2018-02-07: 1.6 mL via INTRATHECAL

## 2018-02-07 MED ORDER — IBUPROFEN 800 MG PO TABS
800.0000 mg | ORAL_TABLET | Freq: Four times a day (QID) | ORAL | Status: DC
  Administered 2018-02-08 – 2018-02-11 (×11): 800 mg via ORAL
  Filled 2018-02-07 (×13): qty 1

## 2018-02-07 MED ORDER — PRENATAL MULTIVITAMIN CH
1.0000 | ORAL_TABLET | Freq: Every day | ORAL | Status: DC
  Administered 2018-02-07 – 2018-02-10 (×4): 1 via ORAL
  Filled 2018-02-07 (×4): qty 1

## 2018-02-07 MED ORDER — COCONUT OIL OIL
1.0000 "application " | TOPICAL_OIL | Status: DC | PRN
  Filled 2018-02-07: qty 120

## 2018-02-07 MED ORDER — KETOROLAC TROMETHAMINE 30 MG/ML IJ SOLN
30.0000 mg | Freq: Four times a day (QID) | INTRAMUSCULAR | Status: AC
  Administered 2018-02-07 – 2018-02-08 (×2): 30 mg via INTRAVENOUS
  Filled 2018-02-07 (×3): qty 1

## 2018-02-07 MED ORDER — PROPOFOL 10 MG/ML IV BOLUS
INTRAVENOUS | Status: DC | PRN
  Administered 2018-02-07: 30 mg via INTRAVENOUS
  Administered 2018-02-07: 10 mg via INTRAVENOUS
  Administered 2018-02-07: 30 mg via INTRAVENOUS
  Administered 2018-02-07: 20 mg via INTRAVENOUS
  Administered 2018-02-07: 10 mg via INTRAVENOUS
  Administered 2018-02-07: 30 mg via INTRAVENOUS
  Administered 2018-02-07 (×2): 10 mg via INTRAVENOUS
  Administered 2018-02-07: 20 mg via INTRAVENOUS
  Administered 2018-02-07: 30 mg via INTRAVENOUS

## 2018-02-07 MED ORDER — OXYTOCIN 40 UNITS IN LACTATED RINGERS INFUSION - SIMPLE MED
2.5000 [IU]/h | INTRAVENOUS | Status: AC
  Administered 2018-02-07: 2.5 [IU]/h via INTRAVENOUS
  Filled 2018-02-07: qty 1000

## 2018-02-07 MED ORDER — SIMETHICONE 80 MG PO CHEW
80.0000 mg | CHEWABLE_TABLET | Freq: Three times a day (TID) | ORAL | Status: DC
  Administered 2018-02-07 – 2018-02-11 (×9): 80 mg via ORAL
  Filled 2018-02-07 (×9): qty 1

## 2018-02-07 MED ORDER — FENTANYL CITRATE (PF) 100 MCG/2ML IJ SOLN
INTRAMUSCULAR | Status: DC | PRN
  Administered 2018-02-07: .15 ug via INTRATHECAL
  Administered 2018-02-07: 85 ug via INTRAVENOUS

## 2018-02-07 MED ORDER — DIPHENHYDRAMINE HCL 50 MG/ML IJ SOLN: 12.5000 mg | INTRAMUSCULAR | Status: DC | PRN

## 2018-02-07 MED ORDER — MIDAZOLAM HCL 2 MG/2ML IJ SOLN
INTRAMUSCULAR | Status: DC | PRN
  Administered 2018-02-07: 2 mg via INTRAVENOUS

## 2018-02-07 MED ORDER — ONDANSETRON HCL 4 MG/2ML IJ SOLN
INTRAMUSCULAR | Status: DC | PRN
  Administered 2018-02-07: 4 mg via INTRAVENOUS

## 2018-02-07 MED ORDER — ONDANSETRON HCL 4 MG/2ML IJ SOLN
INTRAMUSCULAR | Status: AC
  Filled 2018-02-07: qty 2

## 2018-02-07 MED ORDER — DEXAMETHASONE SODIUM PHOSPHATE 10 MG/ML IJ SOLN
INTRAMUSCULAR | Status: AC
  Filled 2018-02-07: qty 1

## 2018-02-07 MED ORDER — FENTANYL CITRATE (PF) 100 MCG/2ML IJ SOLN
INTRAMUSCULAR | Status: AC
  Filled 2018-02-07: qty 2

## 2018-02-07 MED ORDER — MORPHINE SULFATE (PF) 0.5 MG/ML IJ SOLN
INTRAMUSCULAR | Status: AC
  Filled 2018-02-07: qty 10

## 2018-02-07 MED ORDER — PROPOFOL 10 MG/ML IV BOLUS
INTRAVENOUS | Status: AC
  Filled 2018-02-07: qty 40

## 2018-02-07 MED ORDER — MIDAZOLAM HCL 2 MG/2ML IJ SOLN
INTRAMUSCULAR | Status: AC
  Filled 2018-02-07: qty 2

## 2018-02-07 MED ORDER — NALOXONE HCL 4 MG/10ML IJ SOLN: 1.0000 ug/kg/h | INTRAVENOUS | Status: DC | PRN

## 2018-02-07 MED ORDER — DIPHENHYDRAMINE HCL 25 MG PO CAPS: 25.0000 mg | ORAL_CAPSULE | Freq: Four times a day (QID) | ORAL | Status: DC | PRN

## 2018-02-07 MED ORDER — SOD CITRATE-CITRIC ACID 500-334 MG/5ML PO SOLN
ORAL | Status: AC
  Filled 2018-02-07: qty 15

## 2018-02-07 MED ORDER — SIMETHICONE 80 MG PO CHEW
80.0000 mg | CHEWABLE_TABLET | ORAL | Status: DC
  Administered 2018-02-09 – 2018-02-10 (×3): 80 mg via ORAL
  Filled 2018-02-07 (×3): qty 1

## 2018-02-07 MED ORDER — SCOPOLAMINE 1 MG/3DAYS TD PT72: 1.0000 | MEDICATED_PATCH | Freq: Once | TRANSDERMAL | Status: DC

## 2018-02-07 MED ORDER — ONDANSETRON HCL 4 MG/2ML IJ SOLN: 4.0000 mg | Freq: Three times a day (TID) | INTRAMUSCULAR | Status: DC | PRN

## 2018-02-07 MED ORDER — FAMOTIDINE IN NACL 20-0.9 MG/50ML-% IV SOLN
INTRAVENOUS | Status: AC
  Filled 2018-02-07: qty 50

## 2018-02-07 SURGICAL SUPPLY — 39 items
BENZOIN TINCTURE PRP APPL 2/3 (GAUZE/BANDAGES/DRESSINGS) ×3 IMPLANT
CHLORAPREP W/TINT 26ML (MISCELLANEOUS) ×3 IMPLANT
CLAMP CORD UMBIL (MISCELLANEOUS) IMPLANT
CLOSURE STERI-STRIP 1/2X4 (GAUZE/BANDAGES/DRESSINGS) ×1
CLOSURE WOUND 1/2 X4 (GAUZE/BANDAGES/DRESSINGS) ×1
CLOTH BEACON ORANGE TIMEOUT ST (SAFETY) ×3 IMPLANT
CLSR STERI-STRIP ANTIMIC 1/2X4 (GAUZE/BANDAGES/DRESSINGS) ×2 IMPLANT
DRSG OPSITE POSTOP 4X10 (GAUZE/BANDAGES/DRESSINGS) ×3 IMPLANT
ELECT REM PT RETURN 9FT ADLT (ELECTROSURGICAL) ×3
ELECTRODE REM PT RTRN 9FT ADLT (ELECTROSURGICAL) ×1 IMPLANT
EXTRACTOR VACUUM KIWI (MISCELLANEOUS) IMPLANT
GLOVE BIOGEL PI IND STRL 6.5 (GLOVE) ×1 IMPLANT
GLOVE BIOGEL PI IND STRL 7.0 (GLOVE) ×1 IMPLANT
GLOVE BIOGEL PI INDICATOR 6.5 (GLOVE) ×2
GLOVE BIOGEL PI INDICATOR 7.0 (GLOVE) ×2
GLOVE ECLIPSE 6.0 STRL STRAW (GLOVE) ×3 IMPLANT
GOWN STRL REUS W/TWL LRG LVL3 (GOWN DISPOSABLE) ×6 IMPLANT
KIT ABG SYR 3ML LUER SLIP (SYRINGE) IMPLANT
NEEDLE HYPO 25X5/8 SAFETYGLIDE (NEEDLE) IMPLANT
NS IRRIG 1000ML POUR BTL (IV SOLUTION) ×3 IMPLANT
PACK C SECTION WH (CUSTOM PROCEDURE TRAY) ×3 IMPLANT
PAD OB MATERNITY 4.3X12.25 (PERSONAL CARE ITEMS) ×3 IMPLANT
PENCIL SMOKE EVAC W/HOLSTER (ELECTROSURGICAL) ×3 IMPLANT
RETAINER VISCERAL (MISCELLANEOUS) ×3 IMPLANT
RTRCTR C-SECT PINK 25CM LRG (MISCELLANEOUS) ×3 IMPLANT
SPONGE LAP 18X18 RF (DISPOSABLE) ×9 IMPLANT
STRIP CLOSURE SKIN 1/2X4 (GAUZE/BANDAGES/DRESSINGS) ×2 IMPLANT
SUT MNCRL 0 VIOLET CTX 36 (SUTURE) ×2 IMPLANT
SUT MNCRL AB 3-0 PS2 27 (SUTURE) ×6 IMPLANT
SUT MONOCRYL 0 CTX 36 (SUTURE) ×4
SUT PLAIN 0 NONE (SUTURE) IMPLANT
SUT PLAIN 2 0 (SUTURE) ×2
SUT PLAIN ABS 2-0 CT1 27XMFL (SUTURE) ×1 IMPLANT
SUT VIC AB 0 CTX 36 (SUTURE) ×4
SUT VIC AB 0 CTX36XBRD ANBCTRL (SUTURE) ×2 IMPLANT
SUT VIC AB 2-0 CT1 27 (SUTURE) ×2
SUT VIC AB 2-0 CT1 TAPERPNT 27 (SUTURE) ×1 IMPLANT
TOWEL OR 17X24 6PK STRL BLUE (TOWEL DISPOSABLE) ×3 IMPLANT
TRAY FOLEY W/BAG SLVR 14FR LF (SET/KITS/TRAYS/PACK) ×3 IMPLANT

## 2018-02-07 NOTE — Op Note (Signed)
Cesarean Section Procedure Note  Pre-operative Diagnosis: 1. Intrauterine pregnancy at [redacted]w[redacted]d 2. Intrauterine growth restriction with elevated umbilical artery dopplers  3.  Non-reassuring fetal monitoring  Post-operative Diagnosis: same as above  Surgeon: DJerelyn Charles MD  Procedure: Repeat cesarean section   Anesthesia: Spinal anesthesia  Estimated Blood Loss: 104 mL         Drains: Foley catheter         Specimens: placenta to pathology              Complications:  None; patient tolerated the procedure well.         Disposition: PACU - hemodynamically stable.  Findings:  The lower uterine segment was poorly developed.  A low transverse incision was made, but ultimately the incision was through contractile portion of the uterus.  Normal uterus, tubes and ovaries bilaterally.   Viable female infant, 1830g (4lb 0.6 oz) Apgars 8, 9.    Procedure Details   After spinal  anesthesia was found to adequate, the patient was placed in the dorsal supine position with a leftward tilt, prepped and draped in the usual sterile manner. A Pfannenstiel incision was made and carried down through the subcutaneous tissue to the fascia.  The fascia was incised in the midline and the fascial incision was extended laterally with Mayo scissors. The superior aspect of the fascial incision was grasped with two Kocher clamps, tented up and the rectus muscles dissected off sharply. The rectus was then dissected off with blunt dissection and Mayo scissors inferiorly. The rectus muscles were separated in the midline. The abdominal peritoneum was identified, tented up, entered bluntly, and the incision was extended superiorly and inferiorly with good visualization of the bladder. The Alexis retractor was deployed.   The vesicouterine peritoneum was low.  The lower uterine segment was poorly developed.  A transverse uterine incision was made with the scalpel, but ultimately this was through the contractile portion of the  uterus.  Bandage scissors were used to extend the incision laterally.   The fluid was clear. The fetal vertex was identified, elevated out of the pelvis and brought to the hysterotomy.  The head was delivered easily followed by the shoulders and body.  After a 60 second delay per protocol, the cord was clamped and cut and the infant was passed to the waiting neonatologist.  The placenta was then delivered spontaneously, intact and appear normal, the uterus was cleared of all clot and debris   The hysterotomy was repaired in 3 layers.  The deep layer of the muscles was repaired with #0 Monocryl in running locked fashion.  A second layer of #0 Monocryl was placed in running locked fashion closing the more superficial half of the hysterotomy.  A baseball stitch was then used to close the serosa with 2-0 Monocryl.   The Alexis retractor was removed from the abdomen. The peritoneum was examined and all vessels noted to be hemostatic. The abdominal cavity was cleared of all clot and debris.  The peritoneum was closed with 2-0 vicryl in a running fashion.  The rectus muscles were then closed with 2-0 Vicryl. The fascia and rectus muscles were inspected and were hemostatic. The fascia was closed with 0 Vicryl in a running fashion. The subcutaneous layer was irrigated and all bleeders cauterized. The subcutaneous layer was closed with interrupted plain gut. The skin was closed with 3-0 monocryl in a subcuticular fashion. The incision was dressed with benzoine, steri strips and honeycomb dressing. All sponge lap and  needle counts were correct x3. Patient tolerated the procedure well and recovered in stable condition following the procedure.

## 2018-02-07 NOTE — H&P (Signed)
27 y.o. [id number] @ 22w2dpresents for repeat cesarean section. She has been followed by MFM for IUGR with elevated dopplers.  She had an 8/8 BPP in MFM today, but on fetal monitoring had a 4 minute deceleration to the 70s.  MFM recommends delivery today.    Pregnancy c/b: 1. History of preeclampsia with G1: on aspirin 893m2. History of cesarean section: desires repeat 3. Fetal growth restriction: 01/23/18: efw 1693 gm (3lb 12oz), <10%.  UAD have been elevated, again elevated today.   4. Crohn's disease: on no medication  Past Medical History:  Diagnosis Date  . Crohn disease (HCGilchrist  . H/O sickle cell trait   . HSV (herpes simplex virus) infection   . Hypertension   . Seizures (HCWellington   seizure during c/s in 2011    Past Surgical History:  Procedure Laterality Date  . CESAREAN SECTION    . CHOLECYSTECTOMY      OB History  Gravida Para Term Preterm AB Living  4 1 1   2 1   SAB TAB Ectopic Multiple Live Births  1 1     1     # Outcome Date GA Lbr Len/2nd Weight Sex Delivery Anes PTL Lv  4 Current           3 SAB           2 TAB           1 Term     M CS-LTranv   LIV     Penicillins and Orange juice [orange oil]    Prenatal Transfer Tool  Maternal Diabetes: No Genetic Screening: Normal Maternal Ultrasounds/Referrals: Abnormal:  Findings:   IUGR Fetal Ultrasounds or other Referrals:  Referred to Materal Fetal Medicine  Maternal Substance Abuse:  Yes:  Type: Smoker--quit 1-2 mo ago Significant Maternal Medications:  Meds include: Other:  aspirin 813mignificant Maternal Lab Results: None  ABO, Rh: A/Positive/-- (07/08 0000) Antibody: Negative (07/08 0000) Rubella: Immune (07/08 0000) RPR: Nonreactive (07/08 0000)  HBsAg: Negative (07/08 0000)  HIV: Non-reactive (07/08 0000)  GBS:   Neg    Vitals:   02/07/18 1054  BP: 131/85  Pulse: 75  Resp: 18  Temp: 98.2 F (36.8 C)     General:  NAD  FHTs:  Currently 145, minimal variability, no decelerations.  2  decelerations in MFM, one 4 minutes    A/P   27 5o. [id number] for repeat cesarean section for fetal growth restriction, abnormal uterine artery dopplers, and non-reassuring fetal tracing.  Discussed risks of cesarean section to include, but not limited to, infection, bleeding, damage to surrounding strutcures (including bowel, bladder, tubes, ovaries, nerves, vessels, baby), need for additional procedures, risk of blood clot, need for transfusion. Consent signed Clindamycin/gentamicin on call to OR (hives to pcn) [place]

## 2018-02-07 NOTE — ED Notes (Signed)
A second decel noted X 90". Currently at baseline of 150. EFM D/C'd for transport to 162.

## 2018-02-07 NOTE — ED Notes (Signed)
Patient in process of NST. Prolonged decel occurred during UC. Patient repositioned X 2. FHR gradually returned to baseline, but has been nonreactive. Dr. Donalee Citrin notified and came into room to review tracing and discuss with patient.

## 2018-02-07 NOTE — Procedures (Signed)
Kaitlyn Howard August 07, 1990 [redacted]w[redacted]d Fetus A Non-Stress Test Interpretation for 02/07/18  Indication: IUGR  Fetal Heart Rate A Mode: External Baseline Rate (A): 145 bpm Variability: Moderate Accelerations: None Decelerations: Prolonged Multiple birth?: No  Uterine Activity Mode: Palpation, Toco Contraction Frequency (min): irreg Contraction Duration (sec): 45-60 Contraction Quality: Mild Resting Tone Palpated: Relaxed Resting Time: Adequate  Interpretation (Fetal Testing) Nonstress Test Interpretation: Non-reactive Overall Impression: Non-reassuring

## 2018-02-07 NOTE — Anesthesia Preprocedure Evaluation (Addendum)
Anesthesia Evaluation  Patient identified by MRN, date of birth, ID band Patient awake    Reviewed: Allergy & Precautions, NPO status , Patient's Chart, lab work & pertinent test results  Airway Mallampati: I  TM Distance: >3 FB Neck ROM: Full    Dental no notable dental hx. (+) Teeth Intact, Dental Advisory Given   Pulmonary Current Smoker, former smoker,    Pulmonary exam normal breath sounds clear to auscultation       Cardiovascular hypertension, negative cardio ROS Normal cardiovascular exam Rhythm:Regular Rate:Normal     Neuro/Psych negative neurological ROS  negative psych ROS   GI/Hepatic negative GI ROS, (+)     substance abuse  marijuana use, Crohn's   Endo/Other  negative endocrine ROS  Renal/GU negative Renal ROS  negative genitourinary   Musculoskeletal negative musculoskeletal ROS (+)   Abdominal   Peds  Hematology negative hematology ROS (+) Sickle cell trait ,   Anesthesia Other Findings Repeat C/S, IUGR  Reproductive/Obstetrics negative OB ROS                            Anesthesia Physical Anesthesia Plan  ASA: III  Anesthesia Plan: Spinal   Post-op Pain Management:    Induction:   PONV Risk Score and Plan: 2 and Treatment may vary due to age or medical condition  Airway Management Planned: Natural Airway  Additional Equipment:   Intra-op Plan:   Post-operative Plan:   Informed Consent: I have reviewed the patients History and Physical, chart, labs and discussed the procedure including the risks, benefits and alternatives for the proposed anesthesia with the patient or authorized representative who has indicated his/her understanding and acceptance.   Dental advisory given  Plan Discussed with: CRNA  Anesthesia Plan Comments:         Anesthesia Quick Evaluation

## 2018-02-07 NOTE — Lactation Note (Signed)
This note was copied from a baby's chart. Lactation Consultation Note  Patient Name: Kaitlyn Howard TZGYF'V Date: 02/07/2018 Reason for consult: Initial assessment;Other (Comment);Infant < 6lbs;Late-preterm 34-36.6wks;NICU baby;1st time breastfeeding(IUGR)  Visited with mom of a 8 hours old NICU LPI who is being partially BF and formula fed by his mother, she's a P2 but doesn't have experience BF; she only pumped and bottle for her first baby for a day while at the hospital, and she plans to do the same for this baby, only providing breastmilk during hospital stay, once baby goes home, she'll do 100% formula. Baby is currently on Similac 24 calorie formula due to IUGR and being < 5 lbs. Mom participated in the [facility] program at the [facility] during this pregnancy; she doesn't have a pump at home, but her RN has already set her up with a hand pump and a DEBP, LC showed mom how to convert her DEBP into another hand pump for home use.  Mom is already pumping every 2 hours and she has been able to get colostrum, she got about 6 ml on her past pumping session. Explained to mom about the possibility of getting engorged when she stops pumping after discharge, she's aware of engorgement prevention and treatment, that was discussed during her BF classes.  Feeding plan:  1. Encouraged mom to keep double pumping every 2-3 hours and at least once at night 2. Mom will continue turning her EBM to her RN, RN in the process of getting labels for the containeers  BF brochure, BF resources and pumping log were left in the room with mom, she was on the phone and not very receptive to [facility] consultation, but she was appreciative that [place] and [facility] RN shadow Destiny stopped by to check on her. Mom reported all questions and concerns were answered, she's aware of [place] services and will call PRN.   Maternal Data Formula Feeding for Exclusion: No Has patient been taught Hand Expression?: Yes Does the patient have breastfeeding  experience prior to this delivery?: No(She only pumped at bottle while at the hospital with her first baby for a day)  Feeding   Interventions Interventions: Breast feeding basics reviewed  Lactation Tools Discussed/Used Tools: Pump Breast pump type: Double-Electric Breast Pump;Manual WIC Program: Yes Pump Review: Setup, frequency, and cleaning Initiated by:: RN Date initiated:: 02/07/18   Consult Status Consult Status: PRN Date: 02/07/18 Follow-up type: In-patient    Kaitlyn Howard Kaitlyn Howard 02/07/2018, 4:51 PM

## 2018-02-07 NOTE — Transfer of Care (Signed)
Immediate Anesthesia Transfer of Care Note  Patient: Kaitlyn Howard  Procedure(s) Performed: CESAREAN SECTION (N/A )  Patient Location: PACU  Anesthesia Type:Spinal  Level of Consciousness: awake, alert  and oriented  Airway & Oxygen Therapy: Patient Spontanous Breathing  Post-op Assessment: Report given to RN and Post -op Vital signs reviewed and stable  Post vital signs: Reviewed and stable  Last Vitals:  Vitals Value Taken Time  BP 116/88 02/07/2018 12:56 PM  Temp    Pulse 90 02/07/2018  1:00 PM  Resp 19 02/07/2018  1:00 PM  SpO2 98 % 02/07/2018  1:00 PM  Vitals shown include unvalidated device data.  Last Pain:  Vitals:   02/07/18 1054  TempSrc: Oral  PainSc: 0-No pain      Patients Stated Pain Goal: 4 (21/22/48 2500)  Complications: No apparent anesthesia complications

## 2018-02-07 NOTE — Anesthesia Procedure Notes (Signed)
Spinal  Patient location during procedure: OR Start time: 02/07/2018 11:32 AM End time: 02/07/2018 11:42 AM Staffing Anesthesiologist: Freddrick March, MD Performed: anesthesiologist  Preanesthetic Checklist Completed: patient identified, surgical consent, pre-op evaluation, timeout performed, IV checked, risks and benefits discussed and monitors and equipment checked Spinal Block Patient position: sitting Prep: site prepped and draped and DuraPrep Patient monitoring: cardiac monitor, continuous pulse ox and blood pressure Approach: midline Location: L3-4 Injection technique: single-shot Needle Needle type: Pencan  Needle gauge: 24 G Needle length: 9 cm Assessment Sensory level: T6 Additional Notes Functioning IV was confirmed and monitors were applied. Sterile prep and drape, including hand hygiene and sterile gloves were used. The patient was positioned and the spine was prepped. The skin was anesthetized with lidocaine.  Free flow of clear CSF was obtained prior to injecting local anesthetic into the CSF.  The spinal needle aspirated freely following injection.  The needle was carefully withdrawn.  The patient tolerated the procedure well.

## 2018-02-07 NOTE — Anesthesia Postprocedure Evaluation (Signed)
Anesthesia Post Note  Patient: Kaitlyn Howard  Procedure(s) Performed: CESAREAN SECTION (N/A )     Patient location during evaluation: PACU Anesthesia Type: Spinal Level of consciousness: oriented and awake and alert Pain management: pain level controlled Vital Signs Assessment: post-procedure vital signs reviewed and stable Respiratory status: spontaneous breathing, respiratory function stable and patient connected to nasal cannula oxygen Cardiovascular status: blood pressure returned to baseline and stable Postop Assessment: no headache, no backache and no apparent nausea or vomiting Anesthetic complications: no    Last Vitals:  Vitals:   02/07/18 1438 02/07/18 1532  BP: 107/87 121/85  Pulse: 67 72  Resp: 18 18  Temp: 36.7 C 36.9 C  SpO2: 100% 96%    Last Pain:  Vitals:   02/07/18 1615  TempSrc:   PainSc: 5    Pain Goal: Patients Stated Pain Goal: 4 (02/07/18 1054)               [place]

## 2018-02-07 NOTE — Brief Op Note (Signed)
02/07/2018  1:20 PM  PATIENT:  Kaitlyn Howard  27 y.o. female  PRE-OPERATIVE DIAGNOSIS:  REPEAT  POST-OPERATIVE DIAGNOSIS:  NR FHR monitoring and GRowth Restriction  PROCEDURE:  Procedure(s) with comments: CESAREAN SECTION (N/A) - RNFA AVAILABLE  SURGEON:  Surgeon(s) and Role:    Jerelyn Charles, MD - Primary  ANESTHESIA:   spinal  EBL:  104 mL   BLOOD ADMINISTERED:none  DRAINS: none   LOCAL MEDICATIONS USED:  NONE  SPECIMEN:  Source of Specimen:  placenta  DISPOSITION OF SPECIMEN:  PATHOLOGY  COUNTS:  YES  TOURNIQUET:  * No tourniquets in log *  DICTATION: .Note written in EPIC  PLAN OF CARE: Admit to inpatient   PATIENT DISPOSITION:  PACU - hemodynamically stable.   Delay start of Pharmacological VTE agent (>24hrs) due to surgical blood loss or risk of bleeding: not applicable

## 2018-02-08 ENCOUNTER — Encounter (HOSPITAL_COMMUNITY): Payer: Self-pay | Admitting: Obstetrics

## 2018-02-08 LAB — CBC
HEMATOCRIT: 34.6 % — AB (ref 36.0–46.0)
HEMOGLOBIN: 11.9 g/dL — AB (ref 12.0–15.0)
MCH: 27.3 pg (ref 26.0–34.0)
MCHC: 34.4 g/dL (ref 30.0–36.0)
MCV: 79.4 fL — AB (ref 80.0–100.0)
Platelets: 179 10*3/uL (ref 150–400)
RBC: 4.36 MIL/uL (ref 3.87–5.11)
RDW: 14.4 % (ref 11.5–15.5)
WBC: 15.1 10*3/uL — ABNORMAL HIGH (ref 4.0–10.5)
nRBC: 0 % (ref 0.0–0.2)

## 2018-02-08 LAB — RPR: RPR Ser Ql: NONREACTIVE

## 2018-02-08 NOTE — Progress Notes (Signed)
  Patient is eating, ambulating, voiding.  Pain control is good.  Vitals:   02/07/18 2017 02/07/18 2336 02/08/18 0414 02/08/18 0954  BP: 103/68 122/71 124/81 119/75  Pulse: 70 68 64 62  Resp: 15   18  Temp: 98 F (36.7 C) 98.4 F (36.9 C) 98 F (36.7 C) 98 F (36.7 C)  TempSrc: Oral Oral Oral Oral  SpO2: 96% 96% 100% 100%  Weight:      Height:        lungs:   clear to auscultation cor:    RRR Abdomen:  soft, appropriate tenderness, incisions intact and without erythema or exudate ex:    no cords   Lab Results  Component Value Date   WBC 15.1 (H) 02/08/2018   HGB 11.9 (L) 02/08/2018   HCT 34.6 (L) 02/08/2018   MCV 79.4 (L) 02/08/2018   PLT 179 02/08/2018    --/--/A POS Performed at [facility], [address] [place], [place] [address]  (12/27 1150)/[place]  A/P    Post operative day 1.  Routine post op and postpartum care.  Expect d/c 2 days.  Percocet for pain control.

## 2018-02-08 NOTE — Anesthesia Postprocedure Evaluation (Signed)
Anesthesia Post Note  Patient: Kaitlyn Howard  Procedure(s) Performed: CESAREAN SECTION (N/A )     Patient location during evaluation: [facility] Anesthesia Type: Spinal Level of consciousness: oriented and awake and alert Pain management: pain level controlled Vital Signs Assessment: post-procedure vital signs reviewed and stable Respiratory status: spontaneous breathing, respiratory function stable and patient connected to nasal cannula oxygen Cardiovascular status: blood pressure returned to baseline and stable Postop Assessment: no headache, no backache and no apparent nausea or vomiting Anesthetic complications: no    Last Vitals:  Vitals:   02/07/18 2336 02/08/18 0414  BP: 122/71 124/81  Pulse: 68 64  Resp:    Temp: 36.9 C 36.7 C  SpO2: 96% 100%    Last Pain:  Vitals:   02/08/18 0414  TempSrc: Oral  PainSc:    Pain Goal: Patients Stated Pain Goal: 4 (02/07/18 1054)               Nonah Mattes N

## 2018-02-08 NOTE — Addendum Note (Signed)
Addendum  created 02/08/18 0732 by Sandrea Matte, CRNA   Clinical Note Signed

## 2018-02-08 NOTE — Progress Notes (Signed)
CSW acknowledges consult.  CSW attempted to meet with MOB, however MOB was asleep when CSW arrived.  CSW will attempt meet with MOB at a later time.   Laurey Arrow, MSW, LCSW Clinical Social Work [phone]

## 2018-02-09 NOTE — Lactation Note (Signed)
This note was copied from a baby's chart. Lactation Consultation Note  Patient Name: Kaitlyn Howard LHTDS'K Date: 02/09/2018 Reason for consult: Follow-up assessment;Other (Comment);NICU baby;Infant < 6lbs;Late-preterm 34-36.6wks;1st time breastfeeding(IUGR)  RN called for [facility] assistance because mom had questions. LC entered the room with Kaitlyn Sale, RN and greeted mom she was appreciative of LC stopping by. Her main concerns was that even though she's pumping every 3 hours she's not getting "enough". Explained to mom that baby is only 58 hours old plus he was born early, there is infant separation due to baby's stay in the NICU so the process of her milk coming in may take a little longer in comparison with her first child. Baby is being mostly formula fed now with Similac 24 calorie formula.  Mom is now considering BF when she gets discharged, spoke about the benefits of mother's milk for NICU LPI's vs donor milk and formula. [name] assisted mom with breast massage, hand expression and washing her pump parts. When doing hand expression, milk was just pouring of her breasts, especially the right one. Mom was using # 27 flanges, but size 24 seems appropriate at this point. Mom started pumping during [facility] consultation and said: the flanges do make a difference! Praised her for her efforts.  Feeding plan:   1. Encouraged mom to keep double pumping every 2 hours during the day and at least once at night 2. Mom will do breast massage, finger tapping and breast knitting prior pumping 3. Mom will continue turning her EBM to her RN, RN in the process of getting labels for the containeers  Mom reported all questions and concerns were answered, she's aware of [name] and will call PRN.  Maternal Data    Feeding Feeding Type: Formula Nipple Type: Slow - flow  Interventions Interventions: Breast feeding basics reviewed;Breast massage;Hand express;Breast compression;DEBP  Lactation Tools  Discussed/Used Tools: Pump;Flanges Flange Size: 24 Breast pump type: Double-Electric Breast Pump   Consult Status Consult Status: PRN Date: 02/10/18 Follow-up type: In-patient    Kaitlyn Howard 02/09/2018, 2:26 PM

## 2018-02-09 NOTE — Progress Notes (Signed)
  Patient is eating, ambulating, voiding.  Pain control is good.  Vitals:   02/08/18 2052 02/09/18 0028 02/09/18 0507 02/09/18 0658  BP: 122/90 114/76 (!) 131/107 130/89  Pulse: 66 86 80 71  Resp: 18 17 17    Temp: (!) 97.4 F (36.3 C) 97.9 F (36.6 C) (!) 97.4 F (36.3 C)   TempSrc: Oral Oral Oral   SpO2: 100% 100% 100%   Weight:      Height:        lungs:   clear to auscultation cor:    RRR Abdomen:  soft, appropriate tenderness, incisions intact and without erythema or exudate ex:    no cords   Lab Results  Component Value Date   WBC 15.1 (H) 02/08/2018   HGB 11.9 (L) 02/08/2018   HCT 34.6 (L) 02/08/2018   MCV 79.4 (L) 02/08/2018   PLT 179 02/08/2018    --/--/A POS Performed at [facility], [address], [place], [place] [address]  (12/27 1150)/[place]  A/P    Post operative day 2.  Routine post op and postpartum care.  Expect d/c tomorrow- some elevated BPs today although did not meet criteria for protocol.  Will watch today.  Percocet for pain control.

## 2018-02-09 NOTE — Progress Notes (Signed)
CSW acknowledges consult for Edinburgh score of 10.  CSW attempted to meet with MOB again and MOB and a guest were asleep. CSW spoke with bedside nurse and PMAD education will be provided at discharge.  Bedside did not have any concerns or noted any additional psychosocial stressors.    Please consult CSW if concerns arise or by MOB's request.   There are no barriers to discharge.   Laurey Arrow, MSW, LCSW Clinical Social Work [phone]

## 2018-02-10 ENCOUNTER — Encounter (HOSPITAL_COMMUNITY)
Admission: RE | Admit: 2018-02-10 | Discharge: 2018-02-10 | Disposition: A | Discharge status: Home / Self Care | Payer: Medicaid Other | Source: Ambulatory Visit

## 2018-02-10 NOTE — Lactation Note (Signed)
This note was copied from a baby's chart. Lactation Consultation Note  Patient Name: Kaitlyn Howard YFVCB'S Date: 02/10/2018 Reason for consult: Follow-up assessment;NICU baby;Late-preterm 34-36.6wks  Visited with P2 Mom of LPTI in NICU.  Baby at 47 hrs old.  Mom has been double pumping every 2 hrs, using the Symphony pump, and obtaining about 20-30 ml.    WIC providing Mom with a Symphony DEBP, as Mom has a pump at home, but not sure what brand.    Encouraged STS, and asking baby's RN about latching baby to the breast.  Told Mom that [facility] would be able to assist in the NICU prn, to ask RN about calling for lactation.  Encouraged pumping 8-12 times per 24 hrs.     Interventions Interventions: Breast feeding basics reviewed;Skin to skin;Breast massage;Hand express;DEBP  Lactation Tools Discussed/Used Tools: Pump Flange Size: 24 Breast pump type: Double-Electric Breast Pump   Consult Status Consult Status: Follow-up Date: 02/11/18 Follow-up type: In-patient    Kaitlyn Howard 02/10/2018, 10:38 AM

## 2018-02-10 NOTE — Lactation Note (Addendum)
This note was copied from a baby's chart. Lactation Consultation Note  Patient Name: Kaitlyn Howard ELFYB'O Date: 02/10/2018 Reason for consult: Mother's request;NICU baby;Late-preterm 34-36.6wks;Infant < 6lbs Arrived at bedside in NICU. Mom not quite ready to breastfeed. Baby Kaitlyn Kaitlyn Howard now 37 hours old. Mom has not pumped in a while and breasts are engorged/hard.  Attempt with hand expression and reverse pressure softening to soften breasts so infant able to feed. infant would open and latch but unable to maintain.  Left mom and baby STS and obtained 42m nipple shield.  Showed mom how to apply nipple shield and infant latched and breastfed well for close to 20 minutes.  A few gulps were heard.  Sat him up tummy to mummy with mom for feeding to help with possible fast milk flow even though using shield. There is breasmilk warmed so encouraged mom to offer the warmed milk to him as supplement past breastfeeding. Urged mom to practice breastfeeding him as much as she can at his bedside.  Urged mom to call as needed for lactation.    Maternal Data    Feeding Feeding Type: Breast Milk Nipple Type: Slow - flow  LATCH Score                   Interventions Interventions: Assisted with latch;Breast massage;Hand express  Lactation Tools Discussed/Used Tools: Nipple Shields Nipple shield size: 20   Consult Status Consult Status: PRN Date: 02/11/18 Follow-up type: In-patient    HInova Fair Oaks HospitalSThompson Caul12/30/2019, 7:24 PM

## 2018-02-10 NOTE — Progress Notes (Signed)
Patient is eating, ambulating, voiding.  Pain control is good.  Appropriate lochia, no complaints.  Vitals:   02/09/18 1653 02/09/18 1936 02/10/18 0525 02/10/18 0830  BP: 119/85 120/85 121/84 (!) 116/98  Pulse: 81 92 77 86  Resp: 18 16 16 18   Temp: 97.6 F (36.4 C) 98.2 F (36.8 C) 97.8 F (36.6 C) 97.6 F (36.4 C)  TempSrc: Oral Oral Oral   SpO2: 100% 100% 100% 100%  Weight:      Height:        Fundus firm Ext: no calf tenderness  Lab Results  Component Value Date   WBC 15.1 (H) 02/08/2018   HGB 11.9 (L) 02/08/2018   HCT 34.6 (L) 02/08/2018   MCV 79.4 (L) 02/08/2018   PLT 179 02/08/2018    --/--/A POS Performed at [facility], [address], [place], [place] [address]  (12/27 1150)  A/P Post op day #3 s/p c/s Baby in NICU, mother would like to stay additional day  Routine care.  Expect d/c 12/31.    Kaitlyn Howard

## 2018-02-11 MED ORDER — OXYCODONE-ACETAMINOPHEN 5-325 MG PO TABS: 1.0000 | ORAL_TABLET | ORAL | 0 refills | Status: DC | PRN

## 2018-02-11 NOTE — Progress Notes (Signed)
  Patient is eating, ambulating, voiding.  Pain control is good.  Vitals:   02/10/18 0830 02/10/18 1620 02/10/18 1939 02/11/18 0546  BP: (!) 116/98 106/73 (!) 134/95 (!) 114/98  Pulse: 86 96 82 80  Resp: 18 18 19 18   Temp: 97.6 F (36.4 C) (!) 97.5 F (36.4 C) 97.7 F (36.5 C) 97.9 F (36.6 C)  TempSrc:   Oral Oral  SpO2: 100% 99% 100% 100%  Weight:      Height:        lungs:   clear to auscultation cor:    RRR Abdomen:  soft, appropriate tenderness, incisions intact and without erythema or exudate ex:    no cords   Lab Results  Component Value Date   WBC 15.1 (H) 02/08/2018   HGB 11.9 (L) 02/08/2018   HCT 34.6 (L) 02/08/2018   MCV 79.4 (L) 02/08/2018   PLT 179 02/08/2018    --/--/A POS Performed at [facility], [address] [place], [place] [address]  (12/27 1150)/[place]  A/P    Post operative day 4.  Routine post op and postpartum care.  Expect d/c today.  Percocet for pain control.

## 2018-02-11 NOTE — Progress Notes (Signed)
Pt discharged to home with significant other.  Condition stable.  Pt ambulated to car with Astrid Divine, NT.  No equipment for home ordered at discharge.

## 2018-02-11 NOTE — Lactation Note (Signed)
Lactation Consultation Note  Patient Name: Kaitlyn Howard [id number] Date: 02/11/2018   Mother ready to visit NICU baby who BJ47+[redacted] weeks gestation.  Mother is not interested in putting baby to breast regularly until she gets home.  She stated that she needs to be able to see how much volume he is getting to reassure her that his volume is appropriate for discharge.  I educated mother on the importance of putting him to breast while in the hospital so RN and [employer] can observe/assist with latching and that she may supplement after breast feeding if she desires.  Mother is still not interested stating, "I am not worried about him breast feeding.  He has already done that a couple of times."  I acknowledged her desire to pump and bottle feed only at this time.  Encouraged STS as much as possible while visiting in the NICU.  This morning mother's breasts are full but not engorged.  She had many questions related to her milk supply and fullness which I answered to her satisfaction.  Engorgement prevention/treatment discussed.  Mother has a full bottle of breast milk at bedside ready to transport to the NICU.  She has a manual pump and #24 flange size is appropriate at this time.  She has our OP phone number to call for questions/concerns after discharge.  Also encouraged an OP visit if needed once baby has been discharged.  Mother verbalized understanding.  She will call as needed for assistance.   Maternal Data    Feeding    LATCH Score                   Interventions    Lactation Tools Discussed/Used Tools: Pump Breast pump type: Double-Electric Breast Pump   Consult Status      Murvin Gift R Viren Lebeau 02/11/2018, 8:47 AM

## 2018-02-11 NOTE — Discharge Summary (Signed)
Obstetric Discharge Summary Reason for Admission: cesarean section Prenatal Procedures: NST and ultrasound Intrapartum Procedures: cesarean: low cervical, transverse Postpartum Procedures: none Complications-Operative and Postpartum: none Hemoglobin  Date Value Ref Range Status  02/08/2018 11.9 (L) 12.0 - 15.0 g/dL Final   HCT  Date Value Ref Range Status  02/08/2018 34.6 (L) 36.0 - 46.0 % Final    Discharge Diagnoses: IUGR and delivery at 36 weeks  Discharge Information: Date: 02/11/2018 Activity: pelvic rest Diet: routine Medications: Percocet Condition: stable Instructions: refer to practice specific booklet Discharge to: home   Newborn Data: Live born female  Birth Weight: 4 lb 0.6 oz (1830 g) APGAR: 8, 9  Newborn Delivery   Birth date/time:  02[birth year]/2018 12:04:00 Delivery type:  C-Section, Low Transverse Trial of labor:  No C-section categorization:  Repeat    Baby in NICU for continued growth.  Daria Pastures 02/11/2018, 11:33 PM

## 2018-02-12 ENCOUNTER — Ambulatory Visit: Payer: Self-pay

## 2018-02-12 NOTE — Lactation Note (Signed)
This note was copied from a baby's chart. Lactation Consultation Note  Patient Name: Kaitlyn Howard Date: 02/12/2018   [facility] Follow Up Visit:  RN request  Visited with mother in NICU.  Mother was concerned about her full breasts.  Upon assessment, mother's breasts are very full and firm.  She does not appear to be engorged at this time.  She last pumped 5 hours ago.  Reviewed with her how to relieve the fullness to her breasts.  At discharge yesterday I reviewed engorgement prevention/treatment.  She has a manual pump for home use.  Provided further education on breast fullness, pumping, hand expression and the importance of being diligent about pumping more often.  Encouraged mother to bring her pump parts with her when she visits and to pump here at baby's bedside or in one of the private pumping rooms.  She stated that she does not want to do that because, "I don't want to feel like this is my home. "  When I explained how this could help her and her baby she stated, "You are right.  I need to do that."  I offered to get her another pump kit to pump now and she declined stating she was on her way out and would pump as soon as she arrived home.  Plan for breast comfort put in place and mother will call for further questions/concerns.                      Vernie Piet R Jeiden Daughtridge 02/12/2018, 1:10 PM

## 2018-02-13 ENCOUNTER — Ambulatory Visit: Payer: Self-pay

## 2018-02-13 NOTE — Lactation Note (Signed)
This note was copied from a baby's chart. Lactation Consultation Note  Patient Name: Kaitlyn Howard Date: 02/13/2018 Reason for consult: Follow-up assessment;Mother's request;NICU baby;Late-preterm 34-36.6wks;Infant < 6lbs  Visited with mom of a 38 days old early term female who is being exclusively fed breastmilk. Mom called for assistance because she needed some help with the latch. Baby already nursing when entering the NICU pod, mom had baby on cross cradle position to the right breast and a few audible swallows where heard. NICU RN has already charted this feeding, so LC didn't to avoid repetition, even though LC assisted throughout the feedings.  LC able to express colostrum/transitional milk out of mom's left breast, but per mom baby "don't like it" she voiced: "baby don't like the left breast". Mom's right nipple is more everted than the left one. After baby fed for 11 minutes on the right one, she burped him and LC assisted with latching on the left one in both, cross cradle and football position without success, baby would do a few sucks and then unlatch. An attempt was documented in flowsheets. Mom has been using a ns # 20 on the left breast, but couldn't find it, LC told mom she'll be bringing another one.   Mom proceeded to feed baby a botle of breastmilk, but still concerned about her breast feeling full after pumping, she has only been pumping for 10 minutes at at time. Advised mom to pump at least for 15 (or even 20 if needed) and to make sure she's emptying the breast. Mom understands that even though she's taking baby to the breast, a baby this small may not be able to fully empty the breast and he's all caught up developmentally, so she's aware that she'll still need to pump for a while to avoid the risk of engorgement and to follow up with baby's pediatrician in order to adjust the feeding plan as needed.  Feeding plan:  1. Encouraged mom to keep double pumping every 3 hours  for at least 15 minutes during the day and at least once at night 2. Mom will do breast massage, finger tapping and breast knitting prior pumping and check if breasts feel soft after pumping.  3. Mom will use NS # 20 on the left breast PRN, and will pre-pump prior placing NS to evert it with hand pump  When LC was ready to walk out of the NICU pod, NICU NT found the NS # 20, LC washed it and left it ready for mom to use on the next feeding. Mom reported all questions and concerns were answered, she's aware of [facility] services and will call PRN.  Maternal Data    Feeding Feeding Type: Breast Fed Nipple Type: Nfant Standard Flow (white)  LATCH Score Latch: Grasps breast easily, tongue down, lips flanged, rhythmical sucking.  Audible Swallowing: Spontaneous and intermittent  Type of Nipple: Everted at rest and after stimulation  Comfort (Breast/Nipple): Soft / non-tender  Hold (Positioning): Assistance needed to correctly position infant at breast and maintain latch.  LATCH Score: 9  Interventions Interventions: Breast feeding basics reviewed;Assisted with latch;Skin to skin;Breast massage;Hand express;Breast compression;Position options;Support pillows;Adjust position  Lactation Tools Discussed/Used     Consult Status Consult Status: PRN Follow-up type: In-patient    Kaitlyn Howard 02/13/2018, 1:07 PM

## 2019-01-01 ENCOUNTER — Encounter: Payer: Self-pay | Admitting: Physician Assistant

## 2019-03-23 ENCOUNTER — Inpatient Hospital Stay (HOSPITAL_COMMUNITY)
Admission: AD | Admit: 2019-03-23 | Discharge: 2019-03-23 | Disposition: A | Discharge status: Home / Self Care | Payer: Medicaid Other | Attending: Obstetrics and Gynecology | Admitting: Obstetrics and Gynecology

## 2019-03-23 ENCOUNTER — Other Ambulatory Visit: Payer: Self-pay

## 2019-03-23 ENCOUNTER — Encounter (HOSPITAL_COMMUNITY): Payer: Self-pay | Admitting: Obstetrics and Gynecology

## 2019-03-23 DIAGNOSIS — Z3A19 19 weeks gestation of pregnancy: Secondary | ICD-10-CM | POA: Diagnosis not present

## 2019-03-23 DIAGNOSIS — Z3492 Encounter for supervision of normal pregnancy, unspecified, second trimester: Secondary | ICD-10-CM

## 2019-03-23 DIAGNOSIS — O99612 Diseases of the digestive system complicating pregnancy, second trimester: Secondary | ICD-10-CM | POA: Diagnosis not present

## 2019-03-23 LAB — URINALYSIS, ROUTINE W REFLEX MICROSCOPIC
Bilirubin Urine: NEGATIVE
Glucose, UA: NEGATIVE mg/dL
Hgb urine dipstick: NEGATIVE
Ketones, ur: NEGATIVE mg/dL
Leukocytes,Ua: NEGATIVE
Nitrite: NEGATIVE
Protein, ur: NEGATIVE mg/dL
Specific Gravity, Urine: 1.01 (ref 1.005–1.030)
pH: 6 (ref 5.0–8.0)

## 2019-03-23 NOTE — MAU Note (Signed)
Patient states she is [redacted] weeks pregnant.  Reports she is here because she has been having a lot of bleeding from her rectum-has crohns disease.  Also has hemorrhoids but feels like the bleeding is coming from the inside of her rectum.  Also has had a lot of tooth pain so she has been taking an excessive amount of tylenol ( up to 16 pills in one day) for dental issues and is concerned the bleeding could be from taking too much.  Also reports excessive vaginal discharge without odor.

## 2019-03-23 NOTE — MAU Provider Note (Signed)
Patient Kaitlyn Howard is a 29 y.o. [id number] at 42w0dhere with complaints of decreased fetal movements. She also reports incidentally that she has rectal bleeding. Her rectal bleeding is part of her Crohns' disease. She is sure that her bleeding is rectal, not vaginal. She just wanted to hear the baby's heart rate because she was afraid that the baby was affected by the rectal bleeding.   FHR is 155 by Doppler in MAU.   Reassured patient of the normalcy of erratic movements of fetus at this gestation, she should follow up with her PCP about [place].  Patient is very relieved to hear heartbeat and does not desire any further work-up.   KMaye Hides    KMervyn SkeetersKooistra 03/23/2019, 9:03 PM

## 2019-04-21 ENCOUNTER — Encounter: Payer: Self-pay | Admitting: General Practice

## 2019-04-29 ENCOUNTER — Encounter: Payer: Self-pay | Admitting: General Practice

## 2019-05-13 ENCOUNTER — Encounter: Payer: Self-pay | Admitting: Obstetrics & Gynecology

## 2019-05-13 ENCOUNTER — Ambulatory Visit (INDEPENDENT_AMBULATORY_CARE_PROVIDER_SITE_OTHER): Payer: Medicaid Other | Admitting: Obstetrics & Gynecology

## 2019-05-13 ENCOUNTER — Other Ambulatory Visit: Payer: Self-pay

## 2019-05-13 VITALS — BP 122/72 | HR 87 | Wt 147.6 lb

## 2019-05-13 DIAGNOSIS — Z8619 Personal history of other infectious and parasitic diseases: Secondary | ICD-10-CM

## 2019-05-13 DIAGNOSIS — O99611 Diseases of the digestive system complicating pregnancy, first trimester: Secondary | ICD-10-CM | POA: Diagnosis not present

## 2019-05-13 DIAGNOSIS — A6000 Herpesviral infection of urogenital system, unspecified: Secondary | ICD-10-CM

## 2019-05-13 DIAGNOSIS — O09299 Supervision of pregnancy with other poor reproductive or obstetric history, unspecified trimester: Secondary | ICD-10-CM

## 2019-05-13 DIAGNOSIS — O34219 Maternal care for unspecified type scar from previous cesarean delivery: Secondary | ICD-10-CM | POA: Diagnosis not present

## 2019-05-13 DIAGNOSIS — O0990 Supervision of high risk pregnancy, unspecified, unspecified trimester: Secondary | ICD-10-CM

## 2019-05-13 DIAGNOSIS — Z98891 History of uterine scar from previous surgery: Secondary | ICD-10-CM | POA: Insufficient documentation

## 2019-05-13 DIAGNOSIS — K50919 Crohn's disease, unspecified, with unspecified complications: Secondary | ICD-10-CM

## 2019-05-13 DIAGNOSIS — O09219 Supervision of pregnancy with history of pre-term labor, unspecified trimester: Secondary | ICD-10-CM | POA: Diagnosis not present

## 2019-05-13 DIAGNOSIS — Z3A26 26 weeks gestation of pregnancy: Secondary | ICD-10-CM

## 2019-05-13 DIAGNOSIS — Z8759 Personal history of other complications of pregnancy, childbirth and the puerperium: Secondary | ICD-10-CM

## 2019-05-13 DIAGNOSIS — K5090 Crohn's disease, unspecified, without complications: Secondary | ICD-10-CM | POA: Insufficient documentation

## 2019-05-13 HISTORY — DX: Herpesviral infection of urogenital system, unspecified: A60.00

## 2019-05-13 NOTE — Progress Notes (Signed)
   PRENATAL VISIT NOTE  Subjective:  Kaitlyn Howard is a 29 y.o. [id number] at 83w2dbeing seen today [facility] recently [facility] from their practice for unknown reasons.  She is currently monitored for the following issues for this high-risk pregnancy and has History of prior pregnancy with IUGR; Supervision of high risk pregnancy, antepartum; [place] disease ([place]; History of 2 cesarean sections; Hx of preeclampsia, prior pregnancy, currently pregnant; Previous preterm delivery due to IUGR, antepartum; and History of herpes genitalis on their problem list. No acute problems thus far in pregnancy, no Crohn's flare ups, followed by [facility].  Patient reports no complaints.  Contractions: Not present. Vag. Bleeding: None.  Movement: Present. Denies leaking of fluid.   The following portions of the patient's history were reviewed and updated as appropriate: allergies, current medications, past family history, past medical history, past social history, past surgical history and problem list.   Objective:   Vitals:   05/13/19 1510  BP: 122/72  Pulse: 87  Weight: 147 lb 9.6 oz (67 kg)    Fetal Status: Fetal Heart Rate (bpm): 148 Fundal Height: 24 cm Movement: Present     General:  Alert, oriented and cooperative. Patient is in no acute distress.  Skin: Skin is warm and dry. No rash noted.   Cardiovascular: Normal heart rate noted  Respiratory: Normal respiratory effort, no problems with respiration noted  Abdomen: Soft, gravid, appropriate for gestational age.  Pain/Pressure: Present     Pelvic: Cervical exam deferred        Extremities: Normal range of motion.  Edema: None  Mental Status: Normal mood and affect. Normal behavior. Normal judgment and thought content.   Assessment and Plan:  Pregnancy: [id number] 240w2d. History of prior pregnancy with IUGR newborn MFM scan ordered. - [place] OB [place] +14 WK; Future  2. Crohn's disease with  complication, unspecified gastrointestinal tract location ([place], not on meds. Follow up with GI as needed.  3. History of 2 cesarean sections Will be scheduled for BTL  4. Hx of preeclampsia, prior pregnancy, currently pregnant Stable BP, advised to take ASA as directed.  - [place] OB DETAIL +14 WK; Future  5. Previous preterm delivery due to IUGR, antepartum  6. History of herpes genitalis Will need suppression around 34 weeks  7. Supervision of high risk pregnancy, antepartum Considering BTS, will sign papers next visit. Third trimester labs, Tdap next visit.  The nature of [place] [facility] MDs and other Advanced Practice Providers was explained to patient; also emphasized that residents, students are part of our team. Preterm labor symptoms and general obstetric precautions including but not limited to vaginal bleeding, contractions, leaking of fluid and fetal movement were reviewed in detail with the patient. Please refer to After Visit Summary for other counseling recommendations.   Return in about 3 weeks (around 06/03/2019) for 2 hr GTT, 3rd trimester labs, TDap, tubal papers, OFFICE HOB Visit.  Future Appointments  Date Time Provider [place]4/16/2021  2:15 PM [facility] [facility]  05/29/2019  2:15 PM [place] WH-MFCUS MFC-US  06/02/2019  8:20 AM WOC-WOCA LAB WOC-WOCA WOC  06/02/2019  9:15 AM Lashala Laser, UgSallyanne HaversMD WOC-WOCA WOC    UgVerita SchneidersMD

## 2019-05-13 NOTE — Progress Notes (Signed)
Medicaid Home Form Completed-05/13/19

## 2019-05-13 NOTE — Patient Instructions (Addendum)
Return to office for any scheduled appointments. Call the office or go to the MAU at [place] at [facility] if:  You begin to have strong, frequent contractions  Your water breaks.  Sometimes it is a big gush of fluid, sometimes it is just a trickle that keeps getting your panties wet or running down your legs  You have vaginal bleeding.  It is normal to have a small amount of spotting if your cervix was checked.   You do not feel your baby moving like normal.  If you do not, get something to eat and drink and lay down and focus on feeling your baby move.   If your baby is still not moving like normal, you should call the office or go to MAU.  Any other obstetric concerns.   TDaP Vaccine Pregnancy Get the Whooping Cough Vaccine While You Are Pregnant (CDC)  It is important for women to get the whooping cough vaccine in the third trimester of each pregnancy. Vaccines are the best way to prevent this disease. There are 2 different whooping cough vaccines. Both vaccines combine protection against whooping cough, tetanus and diphtheria, but they are for different age groups: Tdap: for everyone 11 years or older, including pregnant women  DTaP: for children 2 months through 59 years of age  You need the whooping cough vaccine during each of your pregnancies The recommended time to get the shot is during your 27th through 36th week of pregnancy, preferably during the earlier part of this time period. The Centers for Disease Control and Prevention (CDC) recommends that pregnant women receive the whooping cough vaccine for adolescents and adults (called Tdap vaccine) during the third trimester of each pregnancy. The recommended time to get the shot is during your 27th through 36th week of pregnancy, preferably during the earlier part of this time period. This replaces the original recommendation that pregnant women get the vaccine only if they had not previously received it. The  [employer] of Obstetricians and Gynecologists and the [employer] support this recommendation.  You should get the whooping cough vaccine while pregnant to pass protection to your baby frame support disabled and/or not supported in this browser  Learn why Mickel Baas decided to get the whooping cough vaccine in her 3rd trimester of pregnancy and how her baby girl was born with some protection against the disease. Also available on YouTube. After receiving the whooping cough vaccine, your body will create protective antibodies (proteins produced by the body to fight off diseases) and pass some of them to your baby before birth. These antibodies provide your baby some short-term protection against whooping cough in early life. These antibodies can also protect your baby from some of the more serious complications that come along with whooping cough. Your protective antibodies are at their highest about 2 weeks after getting the vaccine, but it takes time to pass them to your baby. So the preferred time to get the whooping cough vaccine is early in your third trimester. The amount of whooping cough antibodies in your body decreases over time. That is why CDC recommends you get a whooping cough vaccine during each pregnancy. Doing so allows each of your babies to get the greatest number of protective antibodies from you. This means each of your babies will get the best protection possible against this disease.  Getting the whooping cough vaccine while pregnant is better than getting the vaccine after you give birth Whooping cough vaccination  during pregnancy is ideal so your baby will have short-term protection as soon as he is born. This early protection is important because your baby will not start getting his whooping cough vaccines until he is 2 months old. These first few months of life are when your baby is at greatest risk for catching whooping cough. This is also when he's at  greatest risk for having severe, potentially life-threating complications from the infection. To avoid that gap in protection, it is best to get a whooping cough vaccine during pregnancy. You will then pass protection to your baby before he is born. To continue protecting your baby, he should get whooping cough vaccines starting at 2 months old. You may never have gotten the Tdap vaccine before and did not get it during this pregnancy. If so, you should make sure to get the vaccine immediately after you give birth, before leaving the hospital or birthing center. It will take about 2 weeks before your body develops protection (antibodies) in response to the vaccine. Once you have protection from the vaccine, you are less likely to give whooping cough to your newborn while caring for him. But remember, your baby will still be at risk for catching whooping cough from others. A recent study looked to see how effective Tdap was at preventing whooping cough in babies whose mothers got the vaccine while pregnant or in the hospital after giving birth. The study found that getting Tdap between 27 through 36 weeks of pregnancy is 85% more effective at preventing whooping cough in babies younger than 2 months old. Blood tests cannot tell if you need a whooping cough vaccine There are no blood tests that can tell you if you have enough antibodies in your body to protect yourself or your baby against whooping cough. Even if you have been sick with whooping cough in the past or previously received the vaccine, you still should get the vaccine during each pregnancy. Breastfeeding may pass some protective antibodies onto your baby By breastfeeding, you may pass some antibodies you have made in response to the vaccine to your baby. When you get a whooping cough vaccine during your pregnancy, you will have antibodies in your breast milk that you can share with your baby as soon as your milk comes in. However, your baby will not  get protective antibodies immediately if you wait to get the whooping cough vaccine until after delivering your baby. This is because it takes about 2 weeks for your body to create antibodies. Learn more about the health benefits of breastfeeding.

## 2019-05-14 ENCOUNTER — Encounter: Payer: Self-pay | Admitting: *Deleted

## 2019-05-29 ENCOUNTER — Ambulatory Visit (HOSPITAL_COMMUNITY)
Admission: RE | Admit: 2019-05-29 | Discharge: 2019-05-29 | Disposition: A | Discharge status: Home / Self Care | Payer: Medicaid Other | Source: Ambulatory Visit | Attending: Obstetrics and Gynecology | Admitting: Obstetrics and Gynecology

## 2019-05-29 ENCOUNTER — Ambulatory Visit (HOSPITAL_COMMUNITY): Payer: Medicaid Other | Admitting: *Deleted

## 2019-05-29 ENCOUNTER — Other Ambulatory Visit: Payer: Self-pay

## 2019-05-29 ENCOUNTER — Encounter (HOSPITAL_COMMUNITY): Payer: Self-pay

## 2019-05-29 DIAGNOSIS — O09293 Supervision of pregnancy with other poor reproductive or obstetric history, third trimester: Secondary | ICD-10-CM | POA: Diagnosis not present

## 2019-05-29 DIAGNOSIS — O09299 Supervision of pregnancy with other poor reproductive or obstetric history, unspecified trimester: Secondary | ICD-10-CM | POA: Insufficient documentation

## 2019-05-29 DIAGNOSIS — Z8759 Personal history of other complications of pregnancy, childbirth and the puerperium: Secondary | ICD-10-CM | POA: Insufficient documentation

## 2019-05-29 DIAGNOSIS — K50919 Crohn's disease, unspecified, with unspecified complications: Secondary | ICD-10-CM | POA: Diagnosis not present

## 2019-05-29 DIAGNOSIS — O0990 Supervision of high risk pregnancy, unspecified, unspecified trimester: Secondary | ICD-10-CM | POA: Insufficient documentation

## 2019-05-29 DIAGNOSIS — O99613 Diseases of the digestive system complicating pregnancy, third trimester: Secondary | ICD-10-CM | POA: Diagnosis not present

## 2019-05-29 DIAGNOSIS — Z363 Encounter for antenatal screening for malformations: Secondary | ICD-10-CM | POA: Diagnosis not present

## 2019-06-01 ENCOUNTER — Other Ambulatory Visit (HOSPITAL_COMMUNITY): Payer: Self-pay | Admitting: *Deleted

## 2019-06-01 DIAGNOSIS — O09293 Supervision of pregnancy with other poor reproductive or obstetric history, third trimester: Secondary | ICD-10-CM

## 2019-06-02 ENCOUNTER — Ambulatory Visit (INDEPENDENT_AMBULATORY_CARE_PROVIDER_SITE_OTHER): Payer: Medicaid Other | Admitting: Obstetrics & Gynecology

## 2019-06-02 ENCOUNTER — Other Ambulatory Visit: Payer: Medicaid Other

## 2019-06-02 ENCOUNTER — Other Ambulatory Visit: Payer: Self-pay | Admitting: *Deleted

## 2019-06-02 ENCOUNTER — Other Ambulatory Visit: Payer: Self-pay

## 2019-06-02 VITALS — BP 104/66 | HR 101 | Wt 154.3 lb

## 2019-06-02 DIAGNOSIS — Z98891 History of uterine scar from previous surgery: Secondary | ICD-10-CM

## 2019-06-02 DIAGNOSIS — Z8759 Personal history of other complications of pregnancy, childbirth and the puerperium: Secondary | ICD-10-CM

## 2019-06-02 DIAGNOSIS — K50919 Crohn's disease, unspecified, with unspecified complications: Secondary | ICD-10-CM | POA: Diagnosis not present

## 2019-06-02 DIAGNOSIS — O34219 Maternal care for unspecified type scar from previous cesarean delivery: Secondary | ICD-10-CM | POA: Diagnosis not present

## 2019-06-02 DIAGNOSIS — O0990 Supervision of high risk pregnancy, unspecified, unspecified trimester: Secondary | ICD-10-CM

## 2019-06-02 DIAGNOSIS — Z23 Encounter for immunization: Secondary | ICD-10-CM | POA: Diagnosis not present

## 2019-06-02 DIAGNOSIS — Z3A29 29 weeks gestation of pregnancy: Secondary | ICD-10-CM | POA: Diagnosis not present

## 2019-06-02 DIAGNOSIS — O09293 Supervision of pregnancy with other poor reproductive or obstetric history, third trimester: Secondary | ICD-10-CM | POA: Diagnosis present

## 2019-06-02 DIAGNOSIS — O99613 Diseases of the digestive system complicating pregnancy, third trimester: Secondary | ICD-10-CM | POA: Diagnosis not present

## 2019-06-02 MED ORDER — BLOOD PRESSURE KIT DEVI: 1.0000 | Freq: Once | 0 refills | Status: AC

## 2019-06-02 NOTE — Patient Instructions (Signed)
Return to office for any scheduled appointments. Call the office or go to the MAU at [place] at [facility] if:  You begin to have strong, frequent contractions  Your water breaks.  Sometimes it is a big gush of fluid, sometimes it is just a trickle that keeps getting your panties wet or running down your legs  You have vaginal bleeding.  It is normal to have a small amount of spotting if your cervix was checked.   You do not feel your baby moving like normal.  If you do not, get something to eat and drink and lay down and focus on feeling your baby move.   If your baby is still not moving like normal, you should call the office or go to MAU.  Any other obstetric concerns.   Third Trimester of Pregnancy The third trimester is from week 28 through week 40 (months 7 through 9). The third trimester is a time when the unborn baby (fetus) is growing rapidly. At the end of the ninth month, the fetus is about 20 inches in length and weighs 6-10 pounds. Body changes during your third trimester Your body will continue to go through many changes during pregnancy. The changes vary from woman to woman. During the third trimester:  Your weight will continue to increase. You can expect to gain 25-35 pounds (11-16 kg) by the end of the pregnancy.  You may begin to get stretch marks on your hips, abdomen, and breasts.  You may urinate more often because the fetus is moving lower into your pelvis and pressing on your bladder.  You may develop or continue to have heartburn. This is caused by increased hormones that slow down muscles in the digestive tract.  You may develop or continue to have constipation because increased hormones slow digestion and cause the muscles that push waste through your intestines to relax.  You may develop hemorrhoids. These are swollen veins (varicose veins) in the rectum that can itch or be painful.  You may develop swollen, bulging veins (varicose veins)  in your legs.  You may have increased body aches in the pelvis, back, or thighs. This is due to weight gain and increased hormones that are relaxing your joints.  You may have changes in your hair. These can include thickening of your hair, rapid growth, and changes in texture. Some women also have hair loss during or after pregnancy, or hair that feels dry or thin. Your hair will most likely return to normal after your baby is born.  Your breasts will continue to grow and they will continue to become tender. A yellow fluid (colostrum) may leak from your breasts. This is the first milk you are producing for your baby.  Your belly button may stick out.  You may notice more swelling in your hands, face, or ankles.  You may have increased tingling or numbness in your hands, arms, and legs. The skin on your belly may also feel numb.  You may feel short of breath because of your expanding uterus.  You may have more problems sleeping. This can be caused by the size of your belly, increased need to urinate, and an increase in your body's metabolism.  You may notice the fetus "dropping," or moving lower in your abdomen (lightening).  You may have increased vaginal discharge.  You may notice your joints feel loose and you may have pain around your pelvic bone. What to expect at prenatal visits You will have  prenatal exams every 2 weeks until week 36. Then you will have weekly prenatal exams. During a routine prenatal visit:  You will be weighed to make sure you and the baby are growing normally.  Your blood pressure will be taken.  Your abdomen will be measured to track your baby's growth.  The fetal heartbeat will be listened to.  Any test results from the previous visit will be discussed.  You may have a cervical check near your due date to see if your cervix has softened or thinned (effaced).  You will be tested for Group B streptococcus. This happens between 35 and 37 weeks. Your  health care provider may ask you:  What your birth plan is.  How you are feeling.  If you are feeling the baby move.  If you have had any abnormal symptoms, such as leaking fluid, bleeding, severe headaches, or abdominal cramping.  If you are using any tobacco products, including cigarettes, chewing tobacco, and electronic cigarettes.  If you have any questions. Other tests or screenings that may be performed during your third trimester include:  Blood tests that check for low iron levels (anemia).  Fetal testing to check the health, activity level, and growth of the fetus. Testing is done if you have certain medical conditions or if there are problems during the pregnancy.  Nonstress test (NST). This test checks the health of your baby to make sure there are no signs of problems, such as the baby not getting enough oxygen. During this test, a belt is placed around your belly. The baby is made to move, and its heart rate is monitored during movement. What is false labor? False labor is a condition in which you feel small, irregular tightenings of the muscles in the womb (contractions) that usually go away with rest, changing position, or drinking water. These are called Braxton Hicks contractions. Contractions may last for hours, days, or even weeks before true labor sets in. If contractions come at regular intervals, become more frequent, increase in intensity, or become painful, you should see your health care provider. What are the signs of labor?  Abdominal cramps.  Regular contractions that start at 10 minutes apart and become stronger and more frequent with time.  Contractions that start on the top of the uterus and spread down to the lower abdomen and back.  Increased pelvic pressure and dull back pain.  A watery or bloody mucus discharge that comes from the vagina.  Leaking of amniotic fluid. This is also known as your "water breaking." It could be a slow trickle or a gush.  Let your health care provider know if it has a color or strange odor. If you have any of these signs, call your health care provider right away, even if it is before your due date. Follow these instructions at home: Medicines  Follow your health care provider's instructions regarding medicine use. Specific medicines may be either safe or unsafe to take during pregnancy.  Take a prenatal vitamin that contains at least 600 micrograms (mcg) of folic acid.  If you develop constipation, try taking a stool softener if your health care provider approves. Eating and drinking   Eat a balanced diet that includes fresh fruits and vegetables, whole grains, good sources of protein such as meat, eggs, or tofu, and low-fat dairy. Your health care provider will help you determine the amount of weight gain that is right for you.  Avoid raw meat and uncooked cheese. These carry germs that  can cause birth defects in the baby.  If you have low calcium intake from food, talk to your health care provider about whether you should take a daily calcium supplement.  Eat four or five small meals rather than three large meals a day.  Limit foods that are high in fat and processed sugars, such as fried and sweet foods.  To prevent constipation: ? Drink enough fluid to keep your urine clear or pale yellow. ? Eat foods that are high in fiber, such as fresh fruits and vegetables, whole grains, and beans. Activity  Exercise only as directed by your health care provider. Most women can continue their usual exercise routine during pregnancy. Try to exercise for 30 minutes at least 5 days a week. Stop exercising if you experience uterine contractions.  Avoid heavy lifting.  Do not exercise in extreme heat or humidity, or at high altitudes.  Wear low-heel, comfortable shoes.  Practice good posture.  You may continue to have sex unless your health care provider tells you otherwise. Relieving pain and  discomfort  Take frequent breaks and rest with your legs elevated if you have leg cramps or low back pain.  Take warm sitz baths to soothe any pain or discomfort caused by hemorrhoids. Use hemorrhoid cream if your health care provider approves.  Wear a good support bra to prevent discomfort from breast tenderness.  If you develop varicose veins: ? Wear support pantyhose or compression stockings as told by your healthcare provider. ? Elevate your feet for 15 minutes, 3-4 times a day. Prenatal care  Write down your questions. Take them to your prenatal visits.  Keep all your prenatal visits as told by your health care provider. This is important. Safety  Wear your seat belt at all times when driving.  Make a list of emergency phone numbers, including numbers for family, friends, the hospital, and police and fire departments. General instructions  Avoid cat litter boxes and soil used by cats. These carry germs that can cause birth defects in the baby. If you have a cat, ask someone to clean the litter box for you.  Do not travel far distances unless it is absolutely necessary and only with the approval of your health care provider.  Do not use hot tubs, steam rooms, or saunas.  Do not drink alcohol.  Do not use any products that contain nicotine or tobacco, such as cigarettes and e-cigarettes. If you need help quitting, ask your health care provider.  Do not use any medicinal herbs or unprescribed drugs. These chemicals affect the formation and growth of the baby.  Do not douche or use tampons or scented sanitary pads.  Do not cross your legs for long periods of time.  To prepare for the arrival of your baby: ? Take prenatal classes to understand, practice, and ask questions about labor and delivery. ? Make a trial run to the hospital. ? Visit the hospital and tour the maternity area. ? Arrange for maternity or paternity leave through employers. ? Arrange for family and  friends to take care of pets while you are in the hospital. ? Purchase a rear-facing car seat and make sure you know how to install it in your car. ? Pack your hospital bag. ? Prepare the baby's nursery. Make sure to remove all pillows and stuffed animals from the baby's crib to prevent suffocation.  Visit your dentist if you have not gone during your pregnancy. Use a soft toothbrush to brush your teeth and be  gentle when you floss. Contact a health care provider if:  You are unsure if you are in labor or if your water has broken.  You become dizzy.  You have mild pelvic cramps, pelvic pressure, or nagging pain in your abdominal area.  You have lower back pain.  You have persistent nausea, vomiting, or diarrhea.  You have an unusual or bad smelling vaginal discharge.  You have pain when you urinate. Get help right away if:  Your water breaks before 37 weeks.  You have regular contractions less than 5 minutes apart before 37 weeks.  You have a fever.  You are leaking fluid from your vagina.  You have spotting or bleeding from your vagina.  You have severe abdominal pain or cramping.  You have rapid weight loss or weight gain.  You have shortness of breath with chest pain.  You notice sudden or extreme swelling of your face, hands, ankles, feet, or legs.  Your baby makes fewer than 10 movements in 2 hours.  You have severe headaches that do not go away when you take medicine.  You have vision changes. Summary  The third trimester is from week 28 through week 40, months 7 through 9. The third trimester is a time when the unborn baby (fetus) is growing rapidly.  During the third trimester, your discomfort may increase as you and your baby continue to gain weight. You may have abdominal, leg, and back pain, sleeping problems, and an increased need to urinate.  During the third trimester your breasts will keep growing and they will continue to become tender. A yellow  fluid (colostrum) may leak from your breasts. This is the first milk you are producing for your baby.  False labor is a condition in which you feel small, irregular tightenings of the muscles in the womb (contractions) that eventually go away. These are called Braxton Hicks contractions. Contractions may last for hours, days, or even weeks before true labor sets in.  Signs of labor can include: abdominal cramps; regular contractions that start at 10 minutes apart and become stronger and more frequent with time; watery or bloody mucus discharge that comes from the vagina; increased pelvic pressure and dull back pain; and leaking of amniotic fluid. This information is not intended to replace advice given to you by your health care provider. Make sure you discuss any questions you have with your health care provider. Document Revised: 05/22/2018 Document Reviewed: 03/06/2016 Elsevier Patient Education  [place].

## 2019-06-02 NOTE — Addendum Note (Signed)
Addended by: Louisa Second E on: 06/02/2019 12:11 PM   Modules accepted: Orders

## 2019-06-02 NOTE — Progress Notes (Signed)
PRENATAL VISIT NOTE  Subjective:  Kaitlyn Howard is a 29 y.o. [id number] at 70w1dbeing seen today for ongoing prenatal care.  She is currently monitored for the following issues for this high-risk pregnancy and has History of prior pregnancy with IUGR; Supervision of high risk pregnancy, antepartum; Crohn disease ([place]; History of 2 cesarean sections; Hx of preeclampsia, prior pregnancy, currently pregnant; Previous preterm delivery due to IUGR, antepartum; and History of herpes genitalis on their problem list.  Patient reports upper abdominal pain attribtuted to Crohn's. Will follow up with GI specialisit.  Contractions: Irritability. Vag. Bleeding: None.  Movement: Present. Denies leaking of fluid.   The following portions of the patient's history were reviewed and updated as appropriate: allergies, current medications, past family history, past medical history, past social history, past surgical history and problem list.   Objective:   Vitals:   06/02/19 0834  BP: 104/66  Pulse: (!) 101  Weight: 154 lb 4.8 oz (70 kg)    Fetal Status: Fetal Heart Rate (bpm): 154   Movement: Present     General:  Alert, oriented and cooperative. Patient is in no acute distress.  Skin: Skin is warm and dry. No rash noted.   Cardiovascular: Normal heart rate noted  Respiratory: Normal respiratory effort, no problems with respiration noted  Abdomen: Soft, gravid, appropriate for gestational age.  Pain/Pressure: Present     Pelvic: Cervical exam deferred        Extremities: Normal range of motion.  Edema: None  Mental Status: Normal mood and affect. Normal behavior. Normal judgment and thought content.   Imaging: [place]FM OB DETAIL +14 WK  Result Date: 05/29/2019 ----------------------------------------------------------------------  OBSTETRICS REPORT                       (Signed Final 05/29/2019 03:29 pm) ---------------------------------------------------------------------- Patient Info  ID #:        [id number]                         D.O.B.:  0February 16, 1992(28 yrs)  Name:       Kaitlyn Howard                 Visit Date: 05/29/2019 02:06 pm ---------------------------------------------------------------------- Performed By  Performed By:     DCorky Crafts            [address]. Address[address]     [address]  [place]  Attending:        Johnell Comings MD         [facility]  Referred By:      Osborne Oman MD ---------------------------------------------------------------------- Orders   #  Description                          Code         Ordered By   1  [place] MFM OB DETAIL +14 [place]              16010.93     Verita Schneiders  ----------------------------------------------------------------------   #  Order #                    Accession #                 Episode #   1  [id number]                  [id number]                  [id number]  ---------------------------------------------------------------------- Indications   Poor obstetric history: Previous fetal growth  O09.299   restriction (FGR)   [redacted] weeks gestation of pregnancy                Z3A.28   Encounter for antenatal screening for          Z36.3   malformations   Poor obstetric history: Previous               O09.299   preeclampsia / eclampsia/gestational HTN   History of cesarean delivery, currently        O34.219   pregnant (x2)   Maternal Crohn's disease affecting             O99.613   pregnancy in third trimester   Poor obstetric history: Previous preterm       O09.219   delivery, antepartum(@ 36.2w)   Seizure disorder (seizure during c/s in 2011)  O99.350 G40.909   History of sickle cell trait                   Z86.2   ---------------------------------------------------------------------- Fetal Evaluation  Num Of Fetuses:         1  Fetal Heart Rate(bpm):  142  Cardiac Activity:       Observed  Presentation:           Cephalic  Placenta:               Posterior  P. Cord Insertion:      Visualized  Amniotic Fluid  AFI FV:      Within normal limits  AFI Sum(cm)     %Tile       Largest Pocket(cm)  10.6            16  4.38  RUQ(cm)       RLQ(cm)       LUQ(cm)        LLQ(cm)  2.78          4.38          2.43           1.01 ---------------------------------------------------------------------- Biometry  BPD:        72  mm     G. Age:  28w 6d         48  %    CI:        77.55   %    70 - 86                                                          FL/HC:      20.6   %    19.6 - 20.8  HC:      258.8  mm     G. Age:  28w 1d         10  %    HC/AC:      1.09        0.99 - 1.21  AC:      237.8  mm     G. Age:  28w 1d         28  %    FL/BPD:     74.0   %    71 - 87  FL:       53.3  mm     G. Age:  28w 2d         27  %    FL/AC:      22.4   %    20 - 24  HUM:      50.6  mm     G. Age:  29w 5d         66  %  CER:      37.3  mm     G. Age:  32w 1d       > 95  %  LV:        3.8  mm  CM:        7.4  mm  Est. FW:    1192  gm    2 lb 10 oz      25  % ---------------------------------------------------------------------- OB History  Gravidity:    5         Term:   1        Prem:   1        SAB:   1  TOP:          1        Living:  2 ---------------------------------------------------------------------- Gestational Age  Clinical EDD:  28w 4d                                        EDD:   08/17/19  U/S Today:     28w 3d  EDD:   08/18/19  Best:          Kaitlyn Howard 4d     Det. By:  Clinical EDD             EDD:   08/17/19 ---------------------------------------------------------------------- Anatomy  Cranium:               Appears normal         LVOT:                   Appears normal  Cavum:                 Appears  normal         Aortic Arch:            Appears normal  Ventricles:            Appears normal         Ductal Arch:            Appears normal  Choroid Plexus:        Appears normal         Diaphragm:              Appears normal  Cerebellum:            Appears normal         Stomach:                Appears normal, left                                                                        sided  Posterior Fossa:       Appears normal         Abdomen:                Appears normal  Nuchal Fold:           Not applicable (>10    Abdominal Wall:         Appears nml (cord                         wks GA)                                        insert, abd wall)  Face:                  Appears normal         Cord Vessels:           Appears normal (3                         (orbits and profile)                           vessel cord)  Lips:                  Appears normal         Kidneys:  Appear normal  Palate:                Not well visualized    Bladder:                Appears normal  Thoracic:              Appears normal         Spine:                  Not well visualized  Heart:                 Appears normal         Upper Extremities:      Appears normal                         (4CH, axis, and                         situs)  RVOT:                  Appears normal         Lower Extremities:      Appears normal  Other:  Fetus appears to be a female. Heels visualized. Technically difficult due          to advanced GA and fetal position. ---------------------------------------------------------------------- Cervix Uterus Adnexa  Cervix  Length:           4.21  cm.  Normal appearance by transabdominal scan.  Uterus  No abnormality visualized.  Left Ovary  Size(cm)       2.4  x   1.4    x  2         Vol(ml): 3.52  Within normal limits.  Right Ovary  Size(cm)       3.6  x   1.8    x  2.1       Vol(ml): 7.13  Within normal limits. ---------------------------------------------------------------------- Comments  This  patient was seen for a detailed fetal anatomy scan.  The  patient reports a history of Crohn's disease. She does not  know the name of the medication that she is currently treated  with. She was treated with Remicade prior to her pregnancy.  She reports that she does experience rectal pain and  bleeding related to the Crohn's disease. Her prior pregnancy  was complicated by a growth restricted fetus. The patient had  received her prior ultrasounds and prenatal care with the  OB/GYN practice in [facility].  She denies any other significant past medical history and  denies any problems in her current pregnancy.  She reports that she had a negative screening test for fetal  aneuploidy earlier in her pregnancy.  She was informed that the fetal growth and amniotic fluid  level were appropriate for her gestational age.  There were no obvious fetal anomalies noted on today's  ultrasound exam. However, the views of the fetal anatomy  were limited today due to her advanced gestational age.  The patient was informed that anomalies may be missed due  to technical limitations. If the fetus is in a suboptimal position  or maternal habitus is increased, visualization of the fetus in  the maternal uterus may be impaired.  Due to her history of Crohn's disease and history of a prior  growth restricted fetus, we will continue to follow her with  serial growth  ultrasounds.  A follow-up exam was scheduled in 4 weeks. ----------------------------------------------------------------------                   Johnell Comings, MD Electronically Signed Final Report   05/29/2019 03:29 pm ----------------------------------------------------------------------   Assessment and Plan:  Pregnancy: [id number] at 53w1d1. Crohn's disease with complication, unspecified gastrointestinal tract location ([place] Follow up with GI  2. History of prior pregnancy with IUGR newborn Normal growth recently, follow up serial scans  3. History of 2 cesarean  sections Desires RCS, declines BTS. Considering Nexplanon.  4. Supervision of high risk pregnancy, antepartum Third trimester labs today and Tdap given too.   Preterm labor symptoms and general obstetric precautions including but not limited to vaginal bleeding, contractions, leaking of fluid and fetal movement were reviewed in detail with the patient. Please refer to After Visit Summary for other counseling recommendations.   Return in about 24 days (around 06/26/2019) [facility] scan same day at 8 am).  Future Appointments  Date Time Provider [place] 06/02/2019  9:15 AM AHarolyn Rutherford USallyanne Havers MD WGentry 06/26/2019  8:00 AM WAlachuaWLemon GroveMFC-US  06/26/2019  8:00 AM WFentonUKorea1 WH-MFCUS MFC-US    UVerita Schneiders MD

## 2019-06-03 LAB — CBC
Hematocrit: 35 % (ref 34.0–46.6)
Hemoglobin: 11.5 g/dL (ref 11.1–15.9)
MCH: 26.5 pg — ABNORMAL LOW (ref 26.6–33.0)
MCHC: 32.9 g/dL (ref 31.5–35.7)
MCV: 81 fL (ref 79–97)
Platelets: 240 10*3/uL (ref 150–450)
RBC: 4.34 x10E6/uL (ref 3.77–5.28)
RDW: 13.2 % (ref 11.7–15.4)
WBC: 11.8 10*3/uL — ABNORMAL HIGH (ref 3.4–10.8)

## 2019-06-03 LAB — COMPREHENSIVE METABOLIC PANEL
ALT: 11 IU/L (ref 0–32)
AST: 14 IU/L (ref 0–40)
Albumin/Globulin Ratio: 1.5 (ref 1.2–2.2)
Albumin: 3.7 g/dL — ABNORMAL LOW (ref 3.9–5.0)
Alkaline Phosphatase: 84 IU/L (ref 39–117)
BUN/Creatinine Ratio: 11 (ref 9–23)
BUN: 7 mg/dL (ref 6–20)
Bilirubin Total: <0.2 mg/dL (ref 0.0–1.2)
CO2: 19 mmol/L — ABNORMAL LOW (ref 20–29)
Calcium: 9.1 mg/dL (ref 8.7–10.2)
Chloride: 101 mmol/L (ref 96–106)
Creatinine, Ser: 0.62 mg/dL (ref 0.57–1.00)
GFR calc Af Amer: 143 mL/min/{1.73_m2} (ref >59)
GFR calc non Af Amer: 124 mL/min/{1.73_m2} (ref >59)
Globulin, Total: 2.5 g/dL (ref 1.5–4.5)
Glucose: 103 mg/dL — ABNORMAL HIGH (ref 65–99)
Potassium: 3.4 mmol/L — ABNORMAL LOW (ref 3.5–5.2)
Sodium: 137 mmol/L (ref 134–144)
Total Protein: 6.2 g/dL (ref 6.0–8.5)

## 2019-06-03 LAB — RPR: RPR Ser Ql: NONREACTIVE

## 2019-06-03 LAB — GLUCOSE TOLERANCE, 2 HOURS W/ 1HR
Glucose, 1 hour: 100 mg/dL (ref 65–179)
Glucose, 2 hour: 87 mg/dL (ref 65–152)
Glucose, Fasting: 70 mg/dL (ref 65–91)

## 2019-06-03 LAB — HIV ANTIBODY (ROUTINE TESTING W REFLEX): HIV Screen 4th Generation wRfx: NONREACTIVE

## 2019-06-26 ENCOUNTER — Encounter: Payer: Medicaid Other | Admitting: Family Medicine

## 2019-06-26 ENCOUNTER — Other Ambulatory Visit (HOSPITAL_COMMUNITY): Payer: Self-pay | Admitting: Obstetrics

## 2019-06-26 ENCOUNTER — Ambulatory Visit: Payer: Medicaid Other | Attending: Obstetrics

## 2019-06-26 ENCOUNTER — Ambulatory Visit: Payer: Medicaid Other | Admitting: *Deleted

## 2019-06-26 ENCOUNTER — Other Ambulatory Visit: Payer: Self-pay

## 2019-06-26 ENCOUNTER — Other Ambulatory Visit: Payer: Self-pay | Admitting: *Deleted

## 2019-06-26 ENCOUNTER — Ambulatory Visit: Payer: Medicaid Other

## 2019-06-26 ENCOUNTER — Encounter: Payer: Self-pay | Admitting: *Deleted

## 2019-06-26 DIAGNOSIS — O09293 Supervision of pregnancy with other poor reproductive or obstetric history, third trimester: Secondary | ICD-10-CM | POA: Diagnosis not present

## 2019-06-26 DIAGNOSIS — O99613 Diseases of the digestive system complicating pregnancy, third trimester: Secondary | ICD-10-CM

## 2019-06-26 DIAGNOSIS — Z862 Personal history of diseases of the blood and blood-forming organs and certain disorders involving the immune mechanism: Secondary | ICD-10-CM

## 2019-06-26 DIAGNOSIS — O0990 Supervision of high risk pregnancy, unspecified, unspecified trimester: Secondary | ICD-10-CM

## 2019-06-26 DIAGNOSIS — K5090 Crohn's disease, unspecified, without complications: Secondary | ICD-10-CM

## 2019-06-26 DIAGNOSIS — O322XX0 Maternal care for transverse and oblique lie, not applicable or unspecified: Secondary | ICD-10-CM

## 2019-06-26 DIAGNOSIS — Z362 Encounter for other antenatal screening follow-up: Secondary | ICD-10-CM | POA: Diagnosis not present

## 2019-06-26 DIAGNOSIS — O34219 Maternal care for unspecified type scar from previous cesarean delivery: Secondary | ICD-10-CM

## 2019-06-26 DIAGNOSIS — G40909 Epilepsy, unspecified, not intractable, without status epilepticus: Secondary | ICD-10-CM

## 2019-06-26 DIAGNOSIS — O09219 Supervision of pregnancy with history of pre-term labor, unspecified trimester: Secondary | ICD-10-CM

## 2019-06-26 DIAGNOSIS — O99353 Diseases of the nervous system complicating pregnancy, third trimester: Secondary | ICD-10-CM

## 2019-06-26 DIAGNOSIS — O365990 Maternal care for other known or suspected poor fetal growth, unspecified trimester, not applicable or unspecified: Secondary | ICD-10-CM

## 2019-06-26 DIAGNOSIS — Z3A32 32 weeks gestation of pregnancy: Secondary | ICD-10-CM

## 2019-07-02 ENCOUNTER — Ambulatory Visit: Payer: Medicaid Other | Admitting: *Deleted

## 2019-07-02 ENCOUNTER — Ambulatory Visit: Payer: Medicaid Other | Attending: Obstetrics and Gynecology | Admitting: *Deleted

## 2019-07-02 ENCOUNTER — Other Ambulatory Visit: Payer: Self-pay

## 2019-07-02 DIAGNOSIS — O99613 Diseases of the digestive system complicating pregnancy, third trimester: Secondary | ICD-10-CM | POA: Diagnosis not present

## 2019-07-02 DIAGNOSIS — O365931 Maternal care for other known or suspected poor fetal growth, third trimester, fetus 1: Secondary | ICD-10-CM | POA: Diagnosis not present

## 2019-07-02 DIAGNOSIS — O0990 Supervision of high risk pregnancy, unspecified, unspecified trimester: Secondary | ICD-10-CM | POA: Diagnosis not present

## 2019-07-02 DIAGNOSIS — Z3A33 33 weeks gestation of pregnancy: Secondary | ICD-10-CM | POA: Diagnosis not present

## 2019-07-02 DIAGNOSIS — O0993 Supervision of high risk pregnancy, unspecified, third trimester: Secondary | ICD-10-CM | POA: Diagnosis not present

## 2019-07-02 DIAGNOSIS — K5090 Crohn's disease, unspecified, without complications: Secondary | ICD-10-CM

## 2019-07-02 DIAGNOSIS — Z8759 Personal history of other complications of pregnancy, childbirth and the puerperium: Secondary | ICD-10-CM | POA: Insufficient documentation

## 2019-07-02 NOTE — Procedures (Signed)
Alfred Heffler 1990/03/12 [redacted]w[redacted]d Fetus A Non-Stress Test Interpretation for 07/02/19  Indication: History of IUGR, Previous Pre Eclampsia, & Crohn's Disease  Fetal Heart Rate A Mode: External Baseline Rate (A): 135 bpm Variability: Moderate Accelerations: 15 x 15 Decelerations: None Multiple birth?: No  Uterine Activity Mode: Palpation, Toco Contraction Frequency (min): none Resting Tone Palpated: Relaxed Resting Time: Adequate  Interpretation (Fetal Testing) Nonstress Test Interpretation: Reactive Overall Impression: Reassuring for gestational age Comments: Reviewed tracing with Dr. FAnnamaria Boots

## 2019-07-09 ENCOUNTER — Ambulatory Visit (INDEPENDENT_AMBULATORY_CARE_PROVIDER_SITE_OTHER): Payer: Medicaid Other | Admitting: Obstetrics & Gynecology

## 2019-07-09 ENCOUNTER — Other Ambulatory Visit: Payer: Self-pay

## 2019-07-09 VITALS — BP 116/84 | HR 94 | Wt 164.2 lb

## 2019-07-09 DIAGNOSIS — O34219 Maternal care for unspecified type scar from previous cesarean delivery: Secondary | ICD-10-CM

## 2019-07-09 DIAGNOSIS — O09213 Supervision of pregnancy with history of pre-term labor, third trimester: Secondary | ICD-10-CM

## 2019-07-09 DIAGNOSIS — O0990 Supervision of high risk pregnancy, unspecified, unspecified trimester: Secondary | ICD-10-CM

## 2019-07-09 DIAGNOSIS — Z8759 Personal history of other complications of pregnancy, childbirth and the puerperium: Secondary | ICD-10-CM

## 2019-07-09 DIAGNOSIS — Z3A34 34 weeks gestation of pregnancy: Secondary | ICD-10-CM

## 2019-07-09 DIAGNOSIS — O09219 Supervision of pregnancy with history of pre-term labor, unspecified trimester: Secondary | ICD-10-CM

## 2019-07-09 DIAGNOSIS — O0993 Supervision of high risk pregnancy, unspecified, third trimester: Secondary | ICD-10-CM

## 2019-07-09 DIAGNOSIS — O09299 Supervision of pregnancy with other poor reproductive or obstetric history, unspecified trimester: Secondary | ICD-10-CM

## 2019-07-09 DIAGNOSIS — Z98891 History of uterine scar from previous surgery: Secondary | ICD-10-CM

## 2019-07-09 NOTE — Patient Instructions (Signed)

## 2019-07-09 NOTE — Progress Notes (Signed)
   PRENATAL VISIT NOTE  Subjective:  Kaitlyn Howard is a 29 y.o. [id number] at 42w2dbeing seen today for ongoing prenatal care.  She is currently monitored for the following issues for this high-risk pregnancy and has History of prior pregnancy with IUGR; Supervision of high risk pregnancy, antepartum; Crohn disease ([place]; History of 2 cesarean sections; Hx of preeclampsia, prior pregnancy, currently pregnant; Previous preterm delivery due to IUGR, antepartum; and History of herpes genitalis on their problem list.  Patient reports no complaints.  Contractions: Irritability. Vag. Bleeding: None.  Movement: Present. Denies leaking of fluid.   The following portions of the patient's history were reviewed and updated as appropriate: allergies, current medications, past family history, past medical history, past social history, past surgical history and problem list.   Objective:   Vitals:   07/09/19 0851  BP: 116/84  Pulse: 94  Weight: 74.5 kg    Fetal Status: Fetal Heart Rate (bpm): 161   Movement: Present     General:  Alert, oriented and cooperative. Patient is in no acute distress.  Skin: Skin is warm and dry. No rash noted.   Cardiovascular: Normal heart rate noted  Respiratory: Normal respiratory effort, no problems with respiration noted  Abdomen: Soft, gravid, appropriate for gestational age.  Pain/Pressure: Present     Pelvic: Cervical exam deferred        Extremities: Normal range of motion.  Edema: None  Mental Status: Normal mood and affect. Normal behavior. Normal judgment and thought content.   Assessment and Plan:  Pregnancy: [id number] 350w2d. Supervision of high risk pregnancy, antepartum 11%ile growth  2. Hx of preeclampsia, prior pregnancy, currently pregnant Normal BP  3. Previous preterm delivery due to IUGR, antepartum   4. History of 2 cesarean sections Repeat 39 weeks  5. History of prior pregnancy with IUGR   Preterm labor symptoms and general  obstetric precautions including but not limited to vaginal bleeding, contractions, leaking of fluid and fetal movement were reviewed in detail with the patient. Please refer to After Visit Summary for other counseling recommendations.   Return in about 2 weeks (around 07/23/2019) for GBS.  Future Appointments  Date Time Provider [place]5/28/2021  7:30 AM [facility]  7:30 [facility]  8:15 AM [facility]  8:15 AM [facility] US2 [facility]    JaEmeterio ReeveMD

## 2019-07-09 NOTE — Progress Notes (Signed)
Pt states has seen stars every other day

## 2019-07-10 ENCOUNTER — Encounter: Payer: Self-pay | Admitting: *Deleted

## 2019-07-10 ENCOUNTER — Ambulatory Visit: Payer: Medicaid Other | Admitting: *Deleted

## 2019-07-10 ENCOUNTER — Ambulatory Visit: Payer: Medicaid Other | Attending: Obstetrics and Gynecology

## 2019-07-10 ENCOUNTER — Ambulatory Visit: Payer: Medicaid Other

## 2019-07-10 DIAGNOSIS — O0990 Supervision of high risk pregnancy, unspecified, unspecified trimester: Secondary | ICD-10-CM

## 2019-07-10 DIAGNOSIS — O99613 Diseases of the digestive system complicating pregnancy, third trimester: Secondary | ICD-10-CM | POA: Diagnosis not present

## 2019-07-10 DIAGNOSIS — O09213 Supervision of pregnancy with history of pre-term labor, third trimester: Secondary | ICD-10-CM

## 2019-07-10 DIAGNOSIS — O365990 Maternal care for other known or suspected poor fetal growth, unspecified trimester, not applicable or unspecified: Secondary | ICD-10-CM | POA: Diagnosis present

## 2019-07-10 DIAGNOSIS — Z862 Personal history of diseases of the blood and blood-forming organs and certain disorders involving the immune mechanism: Secondary | ICD-10-CM

## 2019-07-10 DIAGNOSIS — K5090 Crohn's disease, unspecified, without complications: Secondary | ICD-10-CM

## 2019-07-10 DIAGNOSIS — O09293 Supervision of pregnancy with other poor reproductive or obstetric history, third trimester: Secondary | ICD-10-CM

## 2019-07-10 DIAGNOSIS — O99353 Diseases of the nervous system complicating pregnancy, third trimester: Secondary | ICD-10-CM

## 2019-07-10 DIAGNOSIS — O34219 Maternal care for unspecified type scar from previous cesarean delivery: Secondary | ICD-10-CM | POA: Diagnosis not present

## 2019-07-10 DIAGNOSIS — G40909 Epilepsy, unspecified, not intractable, without status epilepticus: Secondary | ICD-10-CM

## 2019-07-10 DIAGNOSIS — Z3A34 34 weeks gestation of pregnancy: Secondary | ICD-10-CM

## 2019-07-14 ENCOUNTER — Other Ambulatory Visit: Payer: Self-pay | Admitting: *Deleted

## 2019-07-14 DIAGNOSIS — O365930 Maternal care for other known or suspected poor fetal growth, third trimester, not applicable or unspecified: Secondary | ICD-10-CM

## 2019-07-17 ENCOUNTER — Other Ambulatory Visit: Payer: Self-pay

## 2019-07-17 ENCOUNTER — Ambulatory Visit: Payer: Medicaid Other | Attending: Obstetrics and Gynecology

## 2019-07-17 ENCOUNTER — Ambulatory Visit: Payer: Medicaid Other | Admitting: *Deleted

## 2019-07-17 DIAGNOSIS — O365990 Maternal care for other known or suspected poor fetal growth, unspecified trimester, not applicable or unspecified: Secondary | ICD-10-CM

## 2019-07-17 DIAGNOSIS — O0990 Supervision of high risk pregnancy, unspecified, unspecified trimester: Secondary | ICD-10-CM

## 2019-07-17 DIAGNOSIS — O365930 Maternal care for other known or suspected poor fetal growth, third trimester, not applicable or unspecified: Secondary | ICD-10-CM

## 2019-07-17 DIAGNOSIS — K5090 Crohn's disease, unspecified, without complications: Secondary | ICD-10-CM

## 2019-07-17 DIAGNOSIS — Z362 Encounter for other antenatal screening follow-up: Secondary | ICD-10-CM

## 2019-07-17 DIAGNOSIS — O09213 Supervision of pregnancy with history of pre-term labor, third trimester: Secondary | ICD-10-CM

## 2019-07-17 DIAGNOSIS — G40909 Epilepsy, unspecified, not intractable, without status epilepticus: Secondary | ICD-10-CM

## 2019-07-17 DIAGNOSIS — O99353 Diseases of the nervous system complicating pregnancy, third trimester: Secondary | ICD-10-CM

## 2019-07-17 DIAGNOSIS — Z862 Personal history of diseases of the blood and blood-forming organs and certain disorders involving the immune mechanism: Secondary | ICD-10-CM

## 2019-07-17 DIAGNOSIS — O99613 Diseases of the digestive system complicating pregnancy, third trimester: Secondary | ICD-10-CM

## 2019-07-17 DIAGNOSIS — Z3A35 35 weeks gestation of pregnancy: Secondary | ICD-10-CM

## 2019-07-23 ENCOUNTER — Ambulatory Visit: Payer: Medicaid Other | Attending: Obstetrics and Gynecology | Admitting: *Deleted

## 2019-07-23 ENCOUNTER — Other Ambulatory Visit: Payer: Self-pay

## 2019-07-23 ENCOUNTER — Ambulatory Visit (HOSPITAL_BASED_OUTPATIENT_CLINIC_OR_DEPARTMENT_OTHER): Payer: Medicaid Other

## 2019-07-23 DIAGNOSIS — K5090 Crohn's disease, unspecified, without complications: Secondary | ICD-10-CM

## 2019-07-23 DIAGNOSIS — O99613 Diseases of the digestive system complicating pregnancy, third trimester: Secondary | ICD-10-CM

## 2019-07-23 DIAGNOSIS — O365930 Maternal care for other known or suspected poor fetal growth, third trimester, not applicable or unspecified: Secondary | ICD-10-CM | POA: Insufficient documentation

## 2019-07-23 DIAGNOSIS — O34219 Maternal care for unspecified type scar from previous cesarean delivery: Secondary | ICD-10-CM

## 2019-07-23 DIAGNOSIS — Z362 Encounter for other antenatal screening follow-up: Secondary | ICD-10-CM

## 2019-07-23 DIAGNOSIS — O09293 Supervision of pregnancy with other poor reproductive or obstetric history, third trimester: Secondary | ICD-10-CM

## 2019-07-23 DIAGNOSIS — G40909 Epilepsy, unspecified, not intractable, without status epilepticus: Secondary | ICD-10-CM

## 2019-07-23 DIAGNOSIS — Z3A00 Weeks of gestation of pregnancy not specified: Secondary | ICD-10-CM | POA: Diagnosis not present

## 2019-07-23 DIAGNOSIS — O09213 Supervision of pregnancy with history of pre-term labor, third trimester: Secondary | ICD-10-CM | POA: Diagnosis not present

## 2019-07-23 DIAGNOSIS — Z862 Personal history of diseases of the blood and blood-forming organs and certain disorders involving the immune mechanism: Secondary | ICD-10-CM

## 2019-07-23 DIAGNOSIS — Z3A36 36 weeks gestation of pregnancy: Secondary | ICD-10-CM

## 2019-07-23 DIAGNOSIS — O0990 Supervision of high risk pregnancy, unspecified, unspecified trimester: Secondary | ICD-10-CM

## 2019-07-23 DIAGNOSIS — O99353 Diseases of the nervous system complicating pregnancy, third trimester: Secondary | ICD-10-CM

## 2019-07-24 ENCOUNTER — Other Ambulatory Visit: Payer: Self-pay | Admitting: *Deleted

## 2019-07-24 DIAGNOSIS — O365930 Maternal care for other known or suspected poor fetal growth, third trimester, not applicable or unspecified: Secondary | ICD-10-CM

## 2019-07-28 ENCOUNTER — Other Ambulatory Visit: Payer: Self-pay

## 2019-07-28 ENCOUNTER — Ambulatory Visit (INDEPENDENT_AMBULATORY_CARE_PROVIDER_SITE_OTHER): Payer: Medicaid Other | Admitting: Obstetrics and Gynecology

## 2019-07-28 ENCOUNTER — Other Ambulatory Visit (HOSPITAL_COMMUNITY)
Admission: RE | Admit: 2019-07-28 | Discharge: 2019-07-28 | Disposition: A | Discharge status: Home / Self Care | Payer: Medicaid Other | Source: Ambulatory Visit | Attending: Obstetrics and Gynecology | Admitting: Obstetrics and Gynecology

## 2019-07-28 VITALS — BP 112/75 | HR 83 | Wt 163.1 lb

## 2019-07-28 DIAGNOSIS — O0990 Supervision of high risk pregnancy, unspecified, unspecified trimester: Secondary | ICD-10-CM

## 2019-07-28 DIAGNOSIS — O09299 Supervision of pregnancy with other poor reproductive or obstetric history, unspecified trimester: Secondary | ICD-10-CM

## 2019-07-28 DIAGNOSIS — Z8759 Personal history of other complications of pregnancy, childbirth and the puerperium: Secondary | ICD-10-CM

## 2019-07-28 DIAGNOSIS — Z3A37 37 weeks gestation of pregnancy: Secondary | ICD-10-CM

## 2019-07-28 DIAGNOSIS — O34219 Maternal care for unspecified type scar from previous cesarean delivery: Secondary | ICD-10-CM

## 2019-07-28 DIAGNOSIS — Z98891 History of uterine scar from previous surgery: Secondary | ICD-10-CM

## 2019-07-28 DIAGNOSIS — O365990 Maternal care for other known or suspected poor fetal growth, unspecified trimester, not applicable or unspecified: Secondary | ICD-10-CM | POA: Insufficient documentation

## 2019-07-28 DIAGNOSIS — O0993 Supervision of high risk pregnancy, unspecified, third trimester: Secondary | ICD-10-CM

## 2019-07-28 DIAGNOSIS — O365930 Maternal care for other known or suspected poor fetal growth, third trimester, not applicable or unspecified: Secondary | ICD-10-CM

## 2019-07-28 NOTE — Progress Notes (Signed)
Subjective:  Kaitlyn Howard is a 29 y.o. [id number] at 57w0dbeing seen today for ongoing prenatal care.  She is currently monitored for the following issues for this high-risk pregnancy and has History of prior pregnancy with IUGR; Supervision of high risk pregnancy, antepartum; Cro[place] disease ([place]; History of 2 cesarean sections; Hx of preeclampsia, prior pregnancy, currently pregnant; Previous preterm delivery due to IUGR, antepartum; History of herpes genitalis; and IUGR (intrauterine growth restriction) affecting care of mother on their problem list.  Patient reports general discomforts.  Contractions: Irregular. Vag. Bleeding: None.  Movement: Present. Denies leaking of fluid.   The following portions of the patient's history were reviewed and updated as appropriate: allergies, current medications, past family history, past medical history, past social history, past surgical history and problem list. Problem list updated.  Objective:   Vitals:   07/28/19 1115  BP: 112/75  Pulse: 83  Weight: 163 lb 1.6 oz (74 kg)    Fetal Status: Fetal Heart Rate (bpm): 134   Movement: Present     General:  Alert, oriented and cooperative. Patient is in no acute distress.  Skin: Skin is warm and dry. No rash noted.   Cardiovascular: Normal heart rate noted  Respiratory: Normal respiratory effort, no problems with respiration noted  Abdomen: Soft, gravid, appropriate for gestational age. Pain/Pressure: Present     Pelvic:  Cervical exam performed        Extremities: Normal range of motion.  Edema: None  Mental Status: Normal mood and affect. Normal behavior. Normal judgment and thought content.   Urinalysis:      Assessment and Plan:  Pregnancy: [id number] 353w0d1. Supervision of high risk pregnancy, antepartum Stable - GC[facility]lam[facility] Culture+Rflx  2. History of prior pregnancy with IUGR Stable Weekly BPP and Doppler studies with MFM Last growth  17 %  3. History of 2 cesarean sections For repeat at 39 weeks, 08/13/19  4. Hx of preeclampsia, prior pregnancy, currently pregnant BP stable  5. Poor fetal growth affecting management of mother in third trimester, single or unspecified fetus See above  Term labor symptoms and general obstetric precautions including but not limited to vaginal bleeding, contractions, leaking of fluid and fetal movement were reviewed in detail with the patient. Please refer to After Visit Summary for other counseling recommendations.  Return in about 1 week (around 08/04/2019) for virtual, OB visit, MD provider.   ErChancy MilroyMD

## 2019-07-28 NOTE — Progress Notes (Signed)
States she never got her blood pressure cuff from [employer] . Checked and it is not in our office. I called [facility] and they confirmed they never filled the order because they could not reach the patient. They asked me to tell Michille she can pick it up today. I informed Tali she can get it today at [facility]. Jacques Navy

## 2019-07-28 NOTE — Patient Instructions (Signed)
Third Trimester of Pregnancy The third trimester is from week 28 through week 40 (months 7 through 9). The third trimester is a time when the unborn baby (fetus) is growing rapidly. At the end of the ninth month, the fetus is about 20 inches in length and weighs 6-10 pounds. Body changes during your third trimester Your body will continue to go through many changes during pregnancy. The changes vary from woman to woman. During the third trimester:  Your weight will continue to increase. You can expect to gain 25-35 pounds (11-16 kg) by the end of the pregnancy.  You may begin to get stretch marks on your hips, abdomen, and breasts.  You may urinate more often because the fetus is moving lower into your pelvis and pressing on your bladder.  You may develop or continue to have heartburn. This is caused by increased hormones that slow down muscles in the digestive tract.  You may develop or continue to have constipation because increased hormones slow digestion and cause the muscles that push waste through your intestines to relax.  You may develop hemorrhoids. These are swollen veins (varicose veins) in the rectum that can itch or be painful.  You may develop swollen, bulging veins (varicose veins) in your legs.  You may have increased body aches in the pelvis, back, or thighs. This is due to weight gain and increased hormones that are relaxing your joints.  You may have changes in your hair. These can include thickening of your hair, rapid growth, and changes in texture. Some women also have hair loss during or after pregnancy, or hair that feels dry or thin. Your hair will most likely return to normal after your baby is born.  Your breasts will continue to grow and they will continue to become tender. A yellow fluid (colostrum) may leak from your breasts. This is the first milk you are producing for your baby.  Your belly button may stick out.  You may notice more swelling in your hands,  face, or ankles.  You may have increased tingling or numbness in your hands, arms, and legs. The skin on your belly may also feel numb.  You may feel short of breath because of your expanding uterus.  You may have more problems sleeping. This can be caused by the size of your belly, increased need to urinate, and an increase in your body's metabolism.  You may notice the fetus "dropping," or moving lower in your abdomen (lightening).  You may have increased vaginal discharge.  You may notice your joints feel loose and you may have pain around your pelvic bone. What to expect at prenatal visits You will have prenatal exams every 2 weeks until week 36. Then you will have weekly prenatal exams. During a routine prenatal visit:  You will be weighed to make sure you and the baby are growing normally.  Your blood pressure will be taken.  Your abdomen will be measured to track your baby's growth.  The fetal heartbeat will be listened to.  Any test results from the previous visit will be discussed.  You may have a cervical check near your due date to see if your cervix has softened or thinned (effaced).  You will be tested for Group B streptococcus. This happens between 35 and 37 weeks. Your health care provider may ask you:  What your birth plan is.  How you are feeling.  If you are feeling the baby move.  If you have had any abnormal  symptoms, such as leaking fluid, bleeding, severe headaches, or abdominal cramping.  If you are using any tobacco products, including cigarettes, chewing tobacco, and electronic cigarettes.  If you have any questions. Other tests or screenings that may be performed during your third trimester include:  Blood tests that check for low iron levels (anemia).  Fetal testing to check the health, activity level, and growth of the fetus. Testing is done if you have certain medical conditions or if there are problems during the pregnancy.  Nonstress test  (NST). This test checks the health of your baby to make sure there are no signs of problems, such as the baby not getting enough oxygen. During this test, a belt is placed around your belly. The baby is made to move, and its heart rate is monitored during movement. What is false labor? False labor is a condition in which you feel small, irregular tightenings of the muscles in the womb (contractions) that usually go away with rest, changing position, or drinking water. These are called Braxton Hicks contractions. Contractions may last for hours, days, or even weeks before true labor sets in. If contractions come at regular intervals, become more frequent, increase in intensity, or become painful, you should see your health care provider. What are the signs of labor?  Abdominal cramps.  Regular contractions that start at 10 minutes apart and become stronger and more frequent with time.  Contractions that start on the top of the uterus and spread down to the lower abdomen and back.  Increased pelvic pressure and dull back pain.  A watery or bloody mucus discharge that comes from the vagina.  Leaking of amniotic fluid. This is also known as your "water breaking." It could be a slow trickle or a gush. Let your health care provider know if it has a color or strange odor. If you have any of these signs, call your health care provider right away, even if it is before your due date. Follow these instructions at home: Medicines  Follow your health care provider's instructions regarding medicine use. Specific medicines may be either safe or unsafe to take during pregnancy.  Take a prenatal vitamin that contains at least 600 micrograms (mcg) of folic acid.  If you develop constipation, try taking a stool softener if your health care provider approves. Eating and drinking   Eat a balanced diet that includes fresh fruits and vegetables, whole grains, good sources of protein such as meat, eggs, or tofu,  and low-fat dairy. Your health care provider will help you determine the amount of weight gain that is right for you.  Avoid raw meat and uncooked cheese. These carry germs that can cause birth defects in the baby.  If you have low calcium intake from food, talk to your health care provider about whether you should take a daily calcium supplement.  Eat four or five small meals rather than three large meals a day.  Limit foods that are high in fat and processed sugars, such as fried and sweet foods.  To prevent constipation: ? Drink enough fluid to keep your urine clear or pale yellow. ? Eat foods that are high in fiber, such as fresh fruits and vegetables, whole grains, and beans. Activity  Exercise only as directed by your health care provider. Most women can continue their usual exercise routine during pregnancy. Try to exercise for 30 minutes at least 5 days a week. Stop exercising if you experience uterine contractions.  Avoid heavy lifting.  Do  not exercise in extreme heat or humidity, or at high altitudes.  Wear low-heel, comfortable shoes.  Practice good posture.  You may continue to have sex unless your health care provider tells you otherwise. Relieving pain and discomfort  Take frequent breaks and rest with your legs elevated if you have leg cramps or low back pain.  Take warm sitz baths to soothe any pain or discomfort caused by hemorrhoids. Use hemorrhoid cream if your health care provider approves.  Wear a good support bra to prevent discomfort from breast tenderness.  If you develop varicose veins: ? Wear support pantyhose or compression stockings as told by your healthcare provider. ? Elevate your feet for 15 minutes, 3-4 times a day. Prenatal care  Write down your questions. Take them to your prenatal visits.  Keep all your prenatal visits as told by your health care provider. This is important. Safety  Wear your seat belt at all times when driving.  Make  a list of emergency phone numbers, including numbers for family, friends, the hospital, and police and fire departments. General instructions  Avoid cat litter boxes and soil used by cats. These carry germs that can cause birth defects in the baby. If you have a cat, ask someone to clean the litter box for you.  Do not travel far distances unless it is absolutely necessary and only with the approval of your health care provider.  Do not use hot tubs, steam rooms, or saunas.  Do not drink alcohol.  Do not use any products that contain nicotine or tobacco, such as cigarettes and e-cigarettes. If you need help quitting, ask your health care provider.  Do not use any medicinal herbs or unprescribed drugs. These chemicals affect the formation and growth of the baby.  Do not douche or use tampons or scented sanitary pads.  Do not cross your legs for long periods of time.  To prepare for the arrival of your baby: ? Take prenatal classes to understand, practice, and ask questions about labor and delivery. ? Make a trial run to the hospital. ? Visit the hospital and tour the maternity area. ? Arrange for maternity or paternity leave through employers. ? Arrange for family and friends to take care of pets while you are in the hospital. ? Purchase a rear-facing car seat and make sure you know how to install it in your car. ? Pack your hospital bag. ? Prepare the baby's nursery. Make sure to remove all pillows and stuffed animals from the baby's crib to prevent suffocation.  Visit your dentist if you have not gone during your pregnancy. Use a soft toothbrush to brush your teeth and be gentle when you floss. Contact a health care provider if:  You are unsure if you are in labor or if your water has broken.  You become dizzy.  You have mild pelvic cramps, pelvic pressure, or nagging pain in your abdominal area.  You have lower back pain.  You have persistent nausea, vomiting, or  diarrhea.  You have an unusual or bad smelling vaginal discharge.  You have pain when you urinate. Get help right away if:  Your water breaks before 37 weeks.  You have regular contractions less than 5 minutes apart before 37 weeks.  You have a fever.  You are leaking fluid from your vagina.  You have spotting or bleeding from your vagina.  You have severe abdominal pain or cramping.  You have rapid weight loss or weight gain.  You have  shortness of breath with chest pain.  You notice sudden or extreme swelling of your face, hands, ankles, feet, or legs.  Your baby makes fewer than 10 movements in 2 hours.  You have severe headaches that do not go away when you take medicine.  You have vision changes. Summary  The third trimester is from week 28 through week 40, months 7 through 9. The third trimester is a time when the unborn baby (fetus) is growing rapidly.  During the third trimester, your discomfort may increase as you and your baby continue to gain weight. You may have abdominal, leg, and back pain, sleeping problems, and an increased need to urinate.  During the third trimester your breasts will keep growing and they will continue to become tender. A yellow fluid (colostrum) may leak from your breasts. This is the first milk you are producing for your baby.  False labor is a condition in which you feel small, irregular tightenings of the muscles in the womb (contractions) that eventually go away. These are called Braxton Hicks contractions. Contractions may last for hours, days, or even weeks before true labor sets in.  Signs of labor can include: abdominal cramps; regular contractions that start at 10 minutes apart and become stronger and more frequent with time; watery or bloody mucus discharge that comes from the vagina; increased pelvic pressure and dull back pain; and leaking of amniotic fluid. This information is not intended to replace advice given to you by your  health care provider. Make sure you discuss any questions you have with your health care provider. Document Revised: 05/22/2018 Document Reviewed: 03/06/2016 Elsevier Patient Education  [place].

## 2019-07-29 LAB — GC/CHLAMYDIA PROBE AMP (~~LOC~~) NOT AT ARMC
Chlamydia: NEGATIVE
Comment: NEGATIVE
Comment: NORMAL
Neisseria Gonorrhea: NEGATIVE

## 2019-07-31 ENCOUNTER — Other Ambulatory Visit: Payer: Self-pay | Admitting: Obstetrics & Gynecology

## 2019-07-31 ENCOUNTER — Other Ambulatory Visit: Payer: Self-pay

## 2019-07-31 ENCOUNTER — Ambulatory Visit (HOSPITAL_BASED_OUTPATIENT_CLINIC_OR_DEPARTMENT_OTHER): Payer: Medicaid Other

## 2019-07-31 ENCOUNTER — Ambulatory Visit: Payer: Medicaid Other | Admitting: *Deleted

## 2019-07-31 DIAGNOSIS — O365930 Maternal care for other known or suspected poor fetal growth, third trimester, not applicable or unspecified: Secondary | ICD-10-CM

## 2019-07-31 DIAGNOSIS — Z3A37 37 weeks gestation of pregnancy: Secondary | ICD-10-CM

## 2019-07-31 DIAGNOSIS — Z862 Personal history of diseases of the blood and blood-forming organs and certain disorders involving the immune mechanism: Secondary | ICD-10-CM

## 2019-07-31 DIAGNOSIS — O0990 Supervision of high risk pregnancy, unspecified, unspecified trimester: Secondary | ICD-10-CM | POA: Insufficient documentation

## 2019-07-31 DIAGNOSIS — O99353 Diseases of the nervous system complicating pregnancy, third trimester: Secondary | ICD-10-CM | POA: Diagnosis not present

## 2019-07-31 DIAGNOSIS — O09293 Supervision of pregnancy with other poor reproductive or obstetric history, third trimester: Secondary | ICD-10-CM | POA: Diagnosis not present

## 2019-07-31 DIAGNOSIS — O34219 Maternal care for unspecified type scar from previous cesarean delivery: Secondary | ICD-10-CM

## 2019-07-31 DIAGNOSIS — O99613 Diseases of the digestive system complicating pregnancy, third trimester: Secondary | ICD-10-CM

## 2019-07-31 DIAGNOSIS — G40909 Epilepsy, unspecified, not intractable, without status epilepticus: Secondary | ICD-10-CM

## 2019-07-31 LAB — STREP GP B CULTURE+RFLX: Strep Gp B Culture+Rflx: NEGATIVE

## 2019-07-31 NOTE — Progress Notes (Signed)
Orders for cesarean section

## 2019-08-01 ENCOUNTER — Inpatient Hospital Stay (HOSPITAL_COMMUNITY): Payer: Medicaid Other | Admitting: Anesthesiology

## 2019-08-01 ENCOUNTER — Encounter (HOSPITAL_COMMUNITY): Payer: Self-pay | Admitting: Obstetrics and Gynecology

## 2019-08-01 ENCOUNTER — Inpatient Hospital Stay (HOSPITAL_COMMUNITY)
Admission: AD | Admit: 2019-08-01 | Discharge: 2019-08-03 | DRG: 787 | Disposition: A | Discharge status: Home / Self Care | Payer: Medicaid Other | Attending: Obstetrics and Gynecology | Admitting: Obstetrics and Gynecology

## 2019-08-01 ENCOUNTER — Encounter (HOSPITAL_COMMUNITY)
Admission: AD | Disposition: A | Discharge status: Home / Self Care | Payer: Self-pay | Source: Home / Self Care | Attending: Obstetrics and Gynecology

## 2019-08-01 ENCOUNTER — Other Ambulatory Visit: Payer: Self-pay

## 2019-08-01 DIAGNOSIS — Z98891 History of uterine scar from previous surgery: Secondary | ICD-10-CM

## 2019-08-01 DIAGNOSIS — K5090 Crohn's disease, unspecified, without complications: Secondary | ICD-10-CM | POA: Diagnosis present

## 2019-08-01 DIAGNOSIS — Z87891 Personal history of nicotine dependence: Secondary | ICD-10-CM

## 2019-08-01 DIAGNOSIS — D573 Sickle-cell trait: Secondary | ICD-10-CM | POA: Diagnosis present

## 2019-08-01 DIAGNOSIS — O26893 Other specified pregnancy related conditions, third trimester: Secondary | ICD-10-CM | POA: Diagnosis present

## 2019-08-01 DIAGNOSIS — O365930 Maternal care for other known or suspected poor fetal growth, third trimester, not applicable or unspecified: Secondary | ICD-10-CM | POA: Diagnosis present

## 2019-08-01 DIAGNOSIS — Z880 Allergy status to penicillin: Secondary | ICD-10-CM

## 2019-08-01 DIAGNOSIS — O9902 Anemia complicating childbirth: Secondary | ICD-10-CM | POA: Diagnosis present

## 2019-08-01 DIAGNOSIS — O34211 Maternal care for low transverse scar from previous cesarean delivery: Secondary | ICD-10-CM | POA: Diagnosis present

## 2019-08-01 DIAGNOSIS — Z3A37 37 weeks gestation of pregnancy: Secondary | ICD-10-CM

## 2019-08-01 DIAGNOSIS — O9962 Diseases of the digestive system complicating childbirth: Secondary | ICD-10-CM | POA: Diagnosis present

## 2019-08-01 DIAGNOSIS — O365990 Maternal care for other known or suspected poor fetal growth, unspecified trimester, not applicable or unspecified: Secondary | ICD-10-CM | POA: Diagnosis present

## 2019-08-01 DIAGNOSIS — Z8619 Personal history of other infectious and parasitic diseases: Secondary | ICD-10-CM | POA: Diagnosis present

## 2019-08-01 DIAGNOSIS — Z20822 Contact with and (suspected) exposure to covid-19: Secondary | ICD-10-CM | POA: Diagnosis present

## 2019-08-01 DIAGNOSIS — O09299 Supervision of pregnancy with other poor reproductive or obstetric history, unspecified trimester: Secondary | ICD-10-CM

## 2019-08-01 DIAGNOSIS — O0990 Supervision of high risk pregnancy, unspecified, unspecified trimester: Secondary | ICD-10-CM

## 2019-08-01 HISTORY — DX: History of uterine scar from previous surgery: Z98.891

## 2019-08-01 LAB — ABO/RH: ABO/RH(D): A POS

## 2019-08-01 LAB — TYPE AND SCREEN
ABO/RH(D): A POS
Antibody Screen: NEGATIVE

## 2019-08-01 LAB — CBC
HCT: 35.5 % — ABNORMAL LOW (ref 36.0–46.0)
Hemoglobin: 12 g/dL (ref 12.0–15.0)
MCH: 26.5 pg (ref 26.0–34.0)
MCHC: 33.8 g/dL (ref 30.0–36.0)
MCV: 78.4 fL — ABNORMAL LOW (ref 80.0–100.0)
Platelets: 174 10*3/uL (ref 150–400)
RBC: 4.53 MIL/uL (ref 3.87–5.11)
RDW: 15 % (ref 11.5–15.5)
WBC: 12.5 10*3/uL — ABNORMAL HIGH (ref 4.0–10.5)
nRBC: 0 % (ref 0.0–0.2)

## 2019-08-01 LAB — SARS CORONAVIRUS 2 BY RT PCR (HOSPITAL ORDER, PERFORMED IN ~~LOC~~ HOSPITAL LAB): SARS Coronavirus 2: NEGATIVE

## 2019-08-01 LAB — RPR: RPR Ser Ql: NONREACTIVE

## 2019-08-01 SURGERY — Surgical Case
Anesthesia: Spinal | Site: Abdomen | Wound class: Clean Contaminated

## 2019-08-01 MED ORDER — ACETAMINOPHEN 325 MG PO TABS: 650.0000 mg | ORAL_TABLET | Freq: Four times a day (QID) | ORAL | Status: DC | PRN

## 2019-08-01 MED ORDER — FENTANYL CITRATE (PF) 100 MCG/2ML IJ SOLN
INTRAMUSCULAR | Status: AC
  Filled 2019-08-01: qty 2

## 2019-08-01 MED ORDER — OXYCODONE HCL 5 MG/5ML PO SOLN: 5.0000 mg | Freq: Once | ORAL | Status: DC | PRN

## 2019-08-01 MED ORDER — SCOPOLAMINE 1 MG/3DAYS TD PT72: 1.0000 | MEDICATED_PATCH | Freq: Once | TRANSDERMAL | Status: DC

## 2019-08-01 MED ORDER — DIPHENHYDRAMINE HCL 25 MG PO CAPS: 25.0000 mg | ORAL_CAPSULE | ORAL | Status: DC | PRN

## 2019-08-01 MED ORDER — PREDNISONE 20 MG PO TABS
40.0000 mg | ORAL_TABLET | Freq: Every day | ORAL | Status: DC
  Administered 2019-08-01 – 2019-08-02 (×2): 20 mg via ORAL
  Administered 2019-08-03: 40 mg via ORAL
  Filled 2019-08-01 (×3): qty 2

## 2019-08-01 MED ORDER — OXYTOCIN-SODIUM CHLORIDE 30-0.9 UT/500ML-% IV SOLN
INTRAVENOUS | Status: DC | PRN
  Administered 2019-08-01: 62.5 mL/h via INTRAVENOUS

## 2019-08-01 MED ORDER — NALBUPHINE HCL 10 MG/ML IJ SOLN: 5.0000 mg | Freq: Once | INTRAMUSCULAR | Status: AC | PRN

## 2019-08-01 MED ORDER — SODIUM CHLORIDE 0.9% FLUSH: 3.0000 mL | INTRAVENOUS | Status: DC | PRN

## 2019-08-01 MED ORDER — NALBUPHINE HCL 10 MG/ML IJ SOLN
5.0000 mg | INTRAMUSCULAR | Status: DC | PRN
  Filled 2019-08-01: qty 1

## 2019-08-01 MED ORDER — ONDANSETRON HCL 4 MG/2ML IJ SOLN
INTRAMUSCULAR | Status: AC
  Filled 2019-08-01: qty 2

## 2019-08-01 MED ORDER — SIMETHICONE 80 MG PO CHEW
80.0000 mg | CHEWABLE_TABLET | Freq: Three times a day (TID) | ORAL | Status: DC
  Administered 2019-08-01 – 2019-08-02 (×5): 80 mg via ORAL
  Filled 2019-08-01 (×5): qty 1

## 2019-08-01 MED ORDER — SIMETHICONE 80 MG PO CHEW
80.0000 mg | CHEWABLE_TABLET | ORAL | Status: DC
  Administered 2019-08-02 (×2): 80 mg via ORAL
  Filled 2019-08-01 (×2): qty 1

## 2019-08-01 MED ORDER — LACTATED RINGERS IV SOLN: INTRAVENOUS | Status: DC

## 2019-08-01 MED ORDER — IBUPROFEN 800 MG PO TABS
800.0000 mg | ORAL_TABLET | Freq: Four times a day (QID) | ORAL | Status: DC
  Administered 2019-08-02 – 2019-08-03 (×6): 800 mg via ORAL
  Filled 2019-08-01 (×6): qty 1

## 2019-08-01 MED ORDER — POVIDONE-IODINE 10 % EX SWAB: 2.0000 "application " | Freq: Once | CUTANEOUS | Status: DC

## 2019-08-01 MED ORDER — ONDANSETRON HCL 4 MG/2ML IJ SOLN: 4.0000 mg | Freq: Three times a day (TID) | INTRAMUSCULAR | Status: DC | PRN

## 2019-08-01 MED ORDER — DIPHENHYDRAMINE HCL 25 MG PO CAPS: 25.0000 mg | ORAL_CAPSULE | Freq: Four times a day (QID) | ORAL | Status: DC | PRN

## 2019-08-01 MED ORDER — ONDANSETRON HCL 4 MG/2ML IJ SOLN: 4.0000 mg | Freq: Once | INTRAMUSCULAR | Status: DC | PRN

## 2019-08-01 MED ORDER — OXYCODONE HCL 5 MG PO TABS: 5.0000 mg | ORAL_TABLET | Freq: Once | ORAL | Status: DC | PRN

## 2019-08-01 MED ORDER — CEFAZOLIN SODIUM-DEXTROSE 2-4 GM/100ML-% IV SOLN
2.0000 g | INTRAVENOUS | Status: AC
  Administered 2019-08-01: 2 g via INTRAVENOUS

## 2019-08-01 MED ORDER — KETOROLAC TROMETHAMINE 30 MG/ML IJ SOLN
30.0000 mg | Freq: Four times a day (QID) | INTRAMUSCULAR | Status: AC
  Administered 2019-08-01: 30 mg via INTRAVENOUS
  Filled 2019-08-01 (×2): qty 1

## 2019-08-01 MED ORDER — CEFAZOLIN SODIUM-DEXTROSE 2-4 GM/100ML-% IV SOLN
INTRAVENOUS | Status: AC
  Filled 2019-08-01: qty 100

## 2019-08-01 MED ORDER — SODIUM CHLORIDE 0.9 % IR SOLN
Status: DC | PRN
  Administered 2019-08-01: 1

## 2019-08-01 MED ORDER — TETANUS-DIPHTH-ACELL PERTUSSIS 5-2.5-18.5 LF-MCG/0.5 IM SUSP: 0.5000 mL | Freq: Once | INTRAMUSCULAR | Status: DC

## 2019-08-01 MED ORDER — DIBUCAINE (PERIANAL) 1 % EX OINT: 1.0000 "application " | TOPICAL_OINTMENT | CUTANEOUS | Status: DC | PRN

## 2019-08-01 MED ORDER — NALOXONE HCL 4 MG/10ML IJ SOLN
1.0000 ug/kg/h | INTRAVENOUS | Status: DC | PRN
  Filled 2019-08-01: qty 5

## 2019-08-01 MED ORDER — DIPHENHYDRAMINE HCL 50 MG/ML IJ SOLN: 12.5000 mg | INTRAMUSCULAR | Status: DC | PRN

## 2019-08-01 MED ORDER — HYDROMORPHONE HCL 1 MG/ML IJ SOLN: 0.2500 mg | INTRAMUSCULAR | Status: DC | PRN

## 2019-08-01 MED ORDER — SENNOSIDES-DOCUSATE SODIUM 8.6-50 MG PO TABS
2.0000 | ORAL_TABLET | ORAL | Status: DC
  Administered 2019-08-02 (×2): 2 via ORAL
  Filled 2019-08-01 (×2): qty 2

## 2019-08-01 MED ORDER — MENTHOL 3 MG MT LOZG: 1.0000 | LOZENGE | OROMUCOSAL | Status: DC | PRN

## 2019-08-01 MED ORDER — NALOXONE HCL 0.4 MG/ML IJ SOLN: 0.4000 mg | INTRAMUSCULAR | Status: DC | PRN

## 2019-08-01 MED ORDER — MEDROXYPROGESTERONE ACETATE 150 MG/ML IM SUSP
150.0000 mg | Freq: Once | INTRAMUSCULAR | Status: AC
  Administered 2019-08-03: 150 mg via INTRAMUSCULAR
  Filled 2019-08-01: qty 1

## 2019-08-01 MED ORDER — ZOLPIDEM TARTRATE 5 MG PO TABS: 5.0000 mg | ORAL_TABLET | Freq: Every evening | ORAL | Status: DC | PRN

## 2019-08-01 MED ORDER — MORPHINE SULFATE (PF) 0.5 MG/ML IJ SOLN
INTRAMUSCULAR | Status: AC
  Filled 2019-08-01: qty 10

## 2019-08-01 MED ORDER — OXYTOCIN-SODIUM CHLORIDE 30-0.9 UT/500ML-% IV SOLN: 2.5000 [IU]/h | INTRAVENOUS | Status: AC

## 2019-08-01 MED ORDER — COCONUT OIL OIL
1.0000 "application " | TOPICAL_OIL | Status: DC | PRN
  Administered 2019-08-02: 1 via TOPICAL

## 2019-08-01 MED ORDER — NALBUPHINE HCL 10 MG/ML IJ SOLN: 5.0000 mg | INTRAMUSCULAR | Status: DC | PRN

## 2019-08-01 MED ORDER — PRENATAL MULTIVITAMIN CH
1.0000 | ORAL_TABLET | Freq: Every day | ORAL | Status: DC
  Administered 2019-08-02 – 2019-08-03 (×2): 1 via ORAL
  Filled 2019-08-01 (×2): qty 1

## 2019-08-01 MED ORDER — OXYCODONE HCL 5 MG PO TABS
5.0000 mg | ORAL_TABLET | ORAL | Status: DC | PRN
  Administered 2019-08-02 (×2): 10 mg via ORAL
  Administered 2019-08-02: 5 mg via ORAL
  Administered 2019-08-02 – 2019-08-03 (×5): 10 mg via ORAL
  Filled 2019-08-01 (×4): qty 2
  Filled 2019-08-01: qty 1
  Filled 2019-08-01 (×3): qty 2

## 2019-08-01 MED ORDER — SIMETHICONE 80 MG PO CHEW: 80.0000 mg | CHEWABLE_TABLET | ORAL | Status: DC | PRN

## 2019-08-01 MED ORDER — ONDANSETRON HCL 4 MG/2ML IJ SOLN
INTRAMUSCULAR | Status: DC | PRN
  Administered 2019-08-01: 4 mg via INTRAVENOUS

## 2019-08-01 MED ORDER — KETOROLAC TROMETHAMINE 30 MG/ML IJ SOLN
30.0000 mg | Freq: Once | INTRAMUSCULAR | Status: AC | PRN
  Administered 2019-08-01: 30 mg via INTRAVENOUS

## 2019-08-01 MED ORDER — WITCH HAZEL-GLYCERIN EX PADS: 1.0000 "application " | MEDICATED_PAD | CUTANEOUS | Status: DC | PRN

## 2019-08-01 MED ORDER — BUPIVACAINE IN DEXTROSE 0.75-8.25 % IT SOLN
INTRATHECAL | Status: DC | PRN
  Administered 2019-08-01: 1.6 mL via INTRATHECAL

## 2019-08-01 MED ORDER — KETOROLAC TROMETHAMINE 30 MG/ML IJ SOLN
INTRAMUSCULAR | Status: AC
  Filled 2019-08-01: qty 1

## 2019-08-01 MED ORDER — STERILE WATER FOR IRRIGATION IR SOLN
Status: DC | PRN
  Administered 2019-08-01: 1

## 2019-08-01 MED ORDER — NALBUPHINE HCL 10 MG/ML IJ SOLN
5.0000 mg | Freq: Once | INTRAMUSCULAR | Status: AC | PRN
  Administered 2019-08-01: 5 mg via SUBCUTANEOUS

## 2019-08-01 MED ORDER — DEXMEDETOMIDINE HCL 200 MCG/2ML IV SOLN
INTRAVENOUS | Status: DC | PRN
  Administered 2019-08-01: 8 ug via INTRAVENOUS

## 2019-08-01 MED ORDER — PHENYLEPHRINE HCL-NACL 20-0.9 MG/250ML-% IV SOLN
INTRAVENOUS | Status: AC
  Filled 2019-08-01: qty 250

## 2019-08-01 MED ORDER — MORPHINE SULFATE (PF) 0.5 MG/ML IJ SOLN
INTRAMUSCULAR | Status: DC | PRN
  Administered 2019-08-01: .15 mg via INTRATHECAL

## 2019-08-01 MED ORDER — PHENYLEPHRINE HCL-NACL 20-0.9 MG/250ML-% IV SOLN
INTRAVENOUS | Status: DC | PRN
  Administered 2019-08-01: 60 ug/min via INTRAVENOUS

## 2019-08-01 MED ORDER — ENOXAPARIN SODIUM 40 MG/0.4ML ~~LOC~~ SOLN
40.0000 mg | SUBCUTANEOUS | Status: DC
  Filled 2019-08-01: qty 0.4

## 2019-08-01 MED ORDER — FENTANYL CITRATE (PF) 100 MCG/2ML IJ SOLN
INTRAMUSCULAR | Status: DC | PRN
  Administered 2019-08-01: 15 ug via INTRATHECAL

## 2019-08-01 SURGICAL SUPPLY — 28 items
CHLORAPREP W/TINT 26ML (MISCELLANEOUS) ×3 IMPLANT
CLAMP CORD UMBIL (MISCELLANEOUS) IMPLANT
DRSG OPSITE POSTOP 4X10 (GAUZE/BANDAGES/DRESSINGS) ×3 IMPLANT
ELECT REM PT RETURN 9FT ADLT (ELECTROSURGICAL) ×3
ELECTRODE REM PT RTRN 9FT ADLT (ELECTROSURGICAL) ×1 IMPLANT
EXTRACTOR VACUUM M CUP 4 TUBE (SUCTIONS) IMPLANT
EXTRACTOR VACUUM M CUP 4' TUBE (SUCTIONS)
GLOVE BIOGEL PI IND STRL 6.5 (GLOVE) ×1 IMPLANT
GLOVE BIOGEL PI IND STRL 7.0 (GLOVE) ×1 IMPLANT
GLOVE BIOGEL PI INDICATOR 6.5 (GLOVE) ×2
GLOVE BIOGEL PI INDICATOR 7.0 (GLOVE) ×2
GLOVE SURG SS PI 6.5 STRL IVOR (GLOVE) ×3 IMPLANT
GOWN STRL REUS W/TWL LRG LVL3 (GOWN DISPOSABLE) ×6 IMPLANT
KIT ABG SYR 3ML LUER SLIP (SYRINGE) IMPLANT
NEEDLE HYPO 25X5/8 SAFETYGLIDE (NEEDLE) IMPLANT
NS IRRIG 1000ML POUR BTL (IV SOLUTION) ×3 IMPLANT
PACK C SECTION WH (CUSTOM PROCEDURE TRAY) ×3 IMPLANT
PAD ABD 7.5X8 STRL (GAUZE/BANDAGES/DRESSINGS) ×3 IMPLANT
PAD OB MATERNITY 4.3X12.25 (PERSONAL CARE ITEMS) ×3 IMPLANT
PENCIL SMOKE EVAC W/HOLSTER (ELECTROSURGICAL) ×3 IMPLANT
RTRCTR C-SECT PINK 25CM LRG (MISCELLANEOUS) IMPLANT
SPONGE GAUZE 4X4 12PLY STER LF (GAUZE/BANDAGES/DRESSINGS) ×6 IMPLANT
SUT PLAIN 0 NONE (SUTURE) IMPLANT
SUT VIC AB 0 CT1 36 (SUTURE) ×12 IMPLANT
SUT VIC AB 4-0 KS 27 (SUTURE) ×3 IMPLANT
TOWEL OR 17X24 6PK STRL BLUE (TOWEL DISPOSABLE) ×3 IMPLANT
TRAY FOLEY W/BAG SLVR 14FR LF (SET/KITS/TRAYS/PACK) ×3 IMPLANT
WATER STERILE IRR 1000ML POUR (IV SOLUTION) ×3 IMPLANT

## 2019-08-01 NOTE — Op Note (Signed)
Kaitlyn Howard PROCEDURE DATE: 08/01/2019  PREOPERATIVE DIAGNOSIS: Intrauterine pregnancy at  6w4dweeks gestation with fetal growth restrictions and previous uterine incision kerr x2  POSTOPERATIVE DIAGNOSIS: The same  PROCEDURE:     Cesarean Section  SURGEON:  Dr. PMora Bellman ASSISTANT: Dr. FMarice Potterand Dr EDione Plover INDICATIONS: Kaitlyn Howard a 29y.o. [id number] 385w4dith fetal growth resctions scheduled for cesarean section secondary to previous uterine incision kerr x2.  The risks of cesarean section discussed with the patient included but were not limited to: bleeding which may require transfusion or reoperation; infection which may require antibiotics; injury to bowel, bladder, ureters or other surrounding organs; injury to the fetus; need for additional procedures including hysterectomy in the event of a life-threatening hemorrhage; placental abnormalities wth subsequent pregnancies, incisional problems, thromboembolic phenomenon and other postoperative/anesthesia complications. The patient concurred with the proposed plan, giving informed written consent for the procedure.    FINDINGS:  Viable female infant in cephalic presentation.  Apgars 9 and 9.  Clear amniotic fluid.  Intact placenta, three vessel cord.  Normal uterus, fallopian tubes and ovaries bilaterally. No intraperitoneal adhesions noted.  ANESTHESIA:    Spinal INTRAVENOUS FLUIDS:1800 ml ESTIMATED BLOOD LOSS: 160 ml URINE OUTPUT:  300 ml SPECIMENS: Placenta sent to L&D COMPLICATIONS: None immediate  PROCEDURE IN DETAIL:  The patient received intravenous antibiotics and had sequential compression devices applied to her lower extremities while in the preoperative area.  She was then taken to the operating room where anesthesia was induced and was found to be adequate. A foley catheter was placed into her bladder and attached to Kaitlyn Howard gravity. She was then placed in a dorsal supine position with a leftward tilt, and  prepped and draped in a sterile manner. After an adequate timeout was performed, a Pfannenstiel skin incision was made with scalpel and carried through to the underlying layer of fascia. The fascia was incised in the midline and this incision was extended bilaterally using the Mayo scissors. Kocher clamps were applied to the superior aspect of the fascial incision and the underlying rectus muscles were dissected off bluntly. A similar process was carried out on the inferior aspect of the facial incision. The rectus muscles were separated in the midline bluntly and the peritoneum was entered bluntly. The Alexis self-retaining retractor was introduced into the abdominal cavity. Attention was turned to the lower uterine segment where a transverse hysterotomy was made with a scalpel and extended bilaterally bluntly. The infant was successfully delivered and delayed cord clamping was performed for 1 minute. The cord was clamped and cut and infant was handed over to awaiting neonatology team. Uterine massage was then administered and the placenta delivered intact with three-vessel cord. The uterus was cleared of clot and debris.  The hysterotomy was closed with 0 Vicryl in a running locked fashion, and an imbricating layer was also placed with a 0 Vicryl. Overall, excellent hemostasis was noted. The pelvis copiously irrigated and cleared of all clot and debris. Hemostasis was confirmed on all surfaces.  The peritoneum and the muscles were reapproximated using 0 vicryl interrupted stitches. The fascia was then closed using 0 Vicryl in a running fashion.  The skin was closed in a subcuticular fashion using 3.0 Vicryl. The patient tolerated the procedure well. Sponge, lap, instrument and needle counts were correct x 2. She was taken to the recovery room in stable condition.    Kaitlyn Howard ConstantMD  08/01/2019 10:11 AM

## 2019-08-01 NOTE — Anesthesia Preprocedure Evaluation (Addendum)
Anesthesia Evaluation  Patient identified by MRN, date of birth, ID band Patient awake    Reviewed: Allergy & Precautions, NPO status , Patient's Chart, lab work & pertinent test results  Airway Mallampati: II  TM Distance: >3 FB Neck ROM: Full    Dental no notable dental hx. (+) Teeth Intact   Pulmonary former smoker,    Pulmonary exam normal breath sounds clear to auscultation       Cardiovascular Exercise Tolerance: Good hypertension, Normal cardiovascular exam Rhythm:Regular Rate:Normal  Pre e w Prior pregnancy   Neuro/Psych negative neurological ROS  negative psych ROS   GI/Hepatic negative GI ROS, Remote substance abuse hx   Endo/Other  negative endocrine ROS  Renal/GU negative Renal ROS     Musculoskeletal negative musculoskeletal ROS (+)   Abdominal   Peds  Hematology  (+) Sickle cell trait , Lab Results      Component                Value               Date                      WBC                      12.5 (H)            08/01/2019                HGB                      12.0                08/01/2019                HCT                      35.5 (L)            08/01/2019                MCV                      78.4 (L)            08/01/2019                PLT                      174                 08/01/2019              Anesthesia Other Findings   Reproductive/Obstetrics (+) Pregnancy                           Anesthesia Physical Anesthesia Plan  ASA: II  Anesthesia Plan: Spinal   Post-op Pain Management:    Induction:   PONV Risk Score and Plan: 3 and Treatment may vary due to age or medical condition, Ondansetron and Dexamethasone  Airway Management Planned: Nasal Cannula and Natural Airway  Additional Equipment: None  Intra-op Plan:   Post-operative Plan:   Informed Consent: I have reviewed the patients History and Physical, chart, labs and discussed the  procedure including the risks, benefits and alternatives for the proposed anesthesia with the patient or authorized representative who has  indicated his/her understanding and acceptance.     Dental advisory given  Plan Discussed with:   Anesthesia Plan Comments: (37.4 wk G5P3 w hx of IUGR for Rpt C/S x3  Type and Screen available)       Anesthesia Quick Evaluation

## 2019-08-01 NOTE — Anesthesia Postprocedure Evaluation (Signed)
Anesthesia Post Note  Patient: Kaitlyn Howard  Procedure(s) Performed: CESAREAN SECTION (N/A Abdomen)     Patient location during evaluation: Mother Baby Anesthesia Type: Spinal Level of consciousness: oriented and awake and alert Pain management: pain level controlled Vital Signs Assessment: post-procedure vital signs reviewed and stable Respiratory status: spontaneous breathing and respiratory function stable Cardiovascular status: blood pressure returned to baseline and stable Postop Assessment: no headache, no backache, no apparent nausea or vomiting and able to ambulate Anesthetic complications: no   No complications documented.  Last Vitals:  Vitals:   08/01/19 1250 08/01/19 1400  BP: 99/68 104/73  Pulse: 72 63  Resp: 18 18  Temp: 36.8 C 36.5 C  SpO2: 98% 99%    Last Pain:  Vitals:   08/01/19 1400  TempSrc: Oral   Pain Goal:                   Barnet Glasgow

## 2019-08-01 NOTE — H&P (Addendum)
LABOR AND DELIVERY ADMISSION HISTORY AND PHYSICAL NOTE  Kaitlyn Howard is a 29 y.o. female [id number] with IUP at 32w4dby LMP c/w 28wk UKoreapresenting for scheduled cesarean.  Patient with history of two prior cesareans, most recent on 02/07/2018 w note made of extension of incision into contractile portion of uterus  Followed by MFM for FGR EFW was 17% on most recent scan from 07/17/2019 She continued to be followed for dopplers and they were elevated 07/31/2019 for which she was scheduled for RCS today  Patient also reports history of Crohns Review of chart shows started on Prednisone with taper for flare last month Reports she is only taking 249mcurrently  She reports positive fetal movement. She denies leakage of fluid or vaginal bleeding.   She plans on breast feeding. She requests Depo for birth control.  [facility]ren[facility] History/Complications[facility]redacted[facility] , CWD, normal anatomy, cephalic presentation, posterior placenta, 17%ile, EFW [id number] complications:  - FGR, resolved - abnormal dopplers  Past Medical History: Past Medical History:  Diagnosis Date  . Cholecystitis with cholelithiasis 08/01/2017  . Crohn disease ([place]  . H/O sickle cell trait   . HSV (herpes simplex virus) infection   . Hypertension   . Se[place] ([place]   seizure during c/s in 2011    Past Surgical History: Past Surgical History:  Procedure Laterality Date  . CESAREAN SECTION    . CESAREAN SECTION N/A 02/07/2018   Procedure: CESAREAN SECTION;  Surgeon: ClJerelyn CharlesMD;  [place]: [place]st[place]s;  Laterality: N/A;  RNFA AVAILABLE  . CHOLECYSTECTOMY      Obstetrical History: OB History    Gravida  5   Para  2   Term  1   Preterm  1   AB  2   Living  2     SAB  1   TAB  1   Ectopic      Multiple  0   Live Births  2           Social History: Social History   Socioeconomic History  . Marital status: Single    Spouse name: Not on  file  . Number of children: Not on file  . Years of education: Not on file  . Highest education level: Not on file  Occupational History  . Not on file  Tobacco Use  . Smoking status: Former Smoker    Types: Cigarettes  . Smokeless tobacco: Never Used  Vaping Use  . Vaping Use: Never used  Substance and Sexual Activity  . Alcohol use: Not Currently  . Drug use: Not Currently    Types: Marijuana  . Sexual activity: Yes    Birth control/protection: None  Other Topics Concern  . Not on file  Social History Narrative  . Not on file   Social Determinants of Health   Financial Resource Strain:   . Difficulty of Paying Living Expenses:   Food Insecurity: No Food Insecurity  . Worried About RuCharity fundraisern the Last Year: Never true  . Ran Out of Food in the Last Year: Never true  Transportation Needs: No Transportation Needs  . Lack of Transportation (Medical): No  . Lack of Transportation (Non-Medical): No  Physical Activity:   . Days of Exercise per Week:   . Minutes of Exercise per Session:   Stress:   . Feeling of Stress :   Social Connections:   . Frequency  of Communication with Friends and Family:   . Frequency of Social Gatherings with Friends and Family:   . Attends Religious Services:   . Active Member of Clubs or Organizations:   . Attends Archivist Meetings:   Marland Kitchen Marital Status:     Family History: Family History  Problem Relation Age of Onset  . Hypertension Mother   . Hypertension Father     Allergies: Allergies  Allergen Reactions  . Penicillins Hives    Has patient had a PCN reaction causing immediate rash, facial/tongue/throat swelling, SOB or lightheadedness with hypotension: No Has patient had a PCN reaction causing severe rash involving mucus membranes or skin necrosis: No Has patient had a PCN reaction that required hospitalization No Has patient had a PCN reaction occurring within the last 10 years: Yes If all of the above  answers are "NO", then may proceed with Cephalosporin use-----------Has taken Rocephin w/out reaction.    Haig Prophet Juice [Orange Oil] Rash    Medications Prior to Admission  Medication Sig Dispense Refill Last Dose  . aspirin 81 MG chewable tablet Chew 81 mg by mouth daily.    07/31/2019 at Unknown time  . predniSONE (DELTASONE) 10 MG tablet Take 10 mg by mouth in the morning and at bedtime.    07/31/2019 at Unknown time  . Prenatal Vit-Fe Fumarate-FA (WESTAB PLUS) 27-1 MG TABS Take 1 tablet by mouth daily.   07/31/2019 at Unknown time     Review of Systems  All systems reviewed and negative except as stated in HPI  Physical Exam Blood pressure 118/83, pulse 85, temperature 98.5 F (36.9 C), temperature source Oral, resp. rate 18, height 5' 7"  (1.702 m), weight 73.9 kg, SpO2 99 %, unknown if currently breastfeeding. General appearance: alert, oriented, NAD Lungs: normal respiratory effort Heart: regular rate Abdomen: soft, non-tender; gravid Extremities: No calf swelling or tenderness FHR: 140 bpm  Prenatal labs: ABO, Rh: --/--/A POS, A POS Performed at [facility] [address], [place], [place] [address]  [phone]/19 0740) Antibody: NEG (06/19 0740) Rubella:    01/2019 (see media tab) RPR: Non Reactive (04/20 0846)  HBsAg:   NR 01/2019 (see media tab) HIV: Non Reactive (04/20 0846)  GC/Chlamydia: neg/neg 07/28/2019  GBS: Negative/-- (06/15 1217)  2-hr GTT: normal 06/02/2019 Genetic screening:  Low risk NIPS 01/2019 (see media tab) Anatomy US: FGR, otherwise normal  Prenatal Transfer Tool  Maternal Diabetes: No Genetic Screening: Normal Maternal Ultrasounds/Referrals: IUGR Fetal Ultrasounds or other Referrals:  None Maternal Substance Abuse:  No Significant Maternal Medications:  Meds include: Other: Prednisone Significant Maternal Lab Results: Group B Strep negative  Results for orders placed or performed during the hospital encounter of 08/01/19 (from the past 24  hour(s))  SARS Coronavirus 2 by RT PCR (hospital order, performed in [facility] hospital lab) Nasopharyngeal Nasopharyngeal Swab   Collection Time: 08/01/19  6:53 AM   Specimen: Nasopharyngeal Swab  Result Value Ref Range   SARS Coronavirus 2 NEGATIVE NEGATIVE  Type and screen   Collection Time: 08/01/19  7:40 AM  Result Value Ref Range   ABO/RH(D) A POS    Antibody Screen NEG    Sample Expiration      08/04/2019,2359 Performed at [facility], [place] [address], [place], [place] [address]   ABO/Rh   Collection Time: 08/01/19  7:40 AM  Result Value Ref Range   ABO/RH(D)      A POS Performed at [facility], [address] [place], [place] [address]   CBC   Collection Time: 08/01/19  7:44 AM  Result Value Ref Range   WBC 12.5 (H) 4.0 - 10.5 K/uL   RBC 4.53 3.87 - 5.11 MIL/uL   Hemoglobin 12.0 12.0 - 15.0 g/dL   HCT 35.5 (L) 36 - 46 %   MCV 78.4 (L) 80.0 - 100.0 fL   MCH 26.5 26.0 - 34.0 pg   MCHC 33.8 30.0 - 36.0 g/dL   RDW 15.0 11.5 - 15.5 %   Platelets 174 150 - 400 K/uL   nRBC 0.0 0.0 - 0.2 %    Patient Active Problem List   Diagnosis Date Noted  . IUGR (intrauterine growth restriction) affecting care of mother 07/28/2019  . Supervision of high risk pregnancy, antepartum 05/13/2019  . History of 2 cesarean sections 05/13/2019  . Hx of preeclampsia, prior pregnancy, currently pregnant 05/13/2019  . Previous preterm delivery due to IUGR, antepartum 05/13/2019  . History of herpes genitalis 05/13/2019  . Crohn disease ([place])   . History of prior pregnancy with IUGR 02/07/2018    Assessment: Nora Sabey is a 29 y.o. [id number] at 84w4dhere for scheduled CS secondary to FGR with abnormal dopplers and history of prior cesarean with extension of incision into contractile portion of uterus.  #RCS:  The risks of cesarean section discussed with the patient included but were not limited to: bleeding which may require transfusion or reoperation; infection which  may require antibiotics; injury to bowel, bladder, ureters or other surrounding organs; injury to the fetus; need for additional procedures including hysterectomy in the event of a life-threatening hemorrhage; placental abnormalities with subsequent pregnancies, incisional problems, thromboembolic phenomenon and other postoperative/anesthesia complications. The patient concurred with the proposed plan, giving informed written consent for the procedure. Patient has been NPO since last night she will remain NPO for procedure. Anesthesia and OR aware. Preoperative prophylactic antibiotics and SCDs ordered on call to the OR. To OR when ready.   #Anesthesia: Spinal #FWB: FHR 140 bpm #GBS/ID: negative #COVID: swab negative on 08/01/2019 #MOF: breast #MOC: Depo #Circ: Yes, inpatient  #Crohn's disease: prescribed 470mPrednisone daily but only taking 2039maily due to symptom of insomnia. Increase to 40m13mily x1 week for stress dosing then taper per GI plan (see Care Everywhere note 06/25/2019):  "taper to 30mg52mly for 2 weeks, then 20mg 52my for 2 weeks, then 10 mg daily for 2 weeks, then 5 mg daily for 2 weeks, then stop it. I will see her shortly after she delivers."   MattheClarnce Flock2021, 8:49 AM

## 2019-08-01 NOTE — Discharge Summary (Addendum)
Postpartum Discharge Summary    Patient Name: Kaitlyn Howard DOB: 12-May-1990 MRN: [id number]  Date of admission: 08/01/2019 Delivery date:08/01/2019  Delivering provider: CONSTANT, PEGGY  Date of discharge: 08/03/2019  Admitting diagnosis: IUGR Repeat c section Intrauterine pregnancy: [redacted]w[redacted]d    Secondary diagnosis:  Active Problems:   Supervision of high risk pregnancy, antepartum   [place]hn disease ([place]   History of 2 cesarean sections   Hx of preeclampsia, prior pregnancy, currently pregnant   History of herpes genitalis   History of cesarean section, classical  Additional problems: Crohn's disease with flare    Discharge diagnosis: Term Pregnancy Delivered                                              Post partum procedures:Depo (given prior to discharge) Augmentation: N/A Complications: None  Hospital course: Sceduled C/S   29y.o. yo [id number] 350w4das admitted to the hospital 08/01/2019 for scheduled cesarean section with the following indication:FGR with abnormal dopplers.Delivery details are as follows: uncomplicated RCS x3 Membrane Rupture Time/Date: 9:52 AM ,08/01/2019   Delivery Method: Repeat LTCS Details of operation can be found in separate operative note.    Patient had an uncomplicated postpartum course.    For her Crohn's flare she was placed on stress dose Prednisone 4042maily x7d, followed by a taper as recommended by her GI doctor: "taper to 34m54mily for 2 weeks, then 20mg58mly for 2 weeks, then 10 mg daily for 2 weeks, then 5 mg daily for 2 weeks, then stop"  At time of discharge she is ambulating, tolerating a regular diet, passing flatus, and urinating well. Patient is discharged home in stable condition on  08/01/19        Newborn Data: Birth date:08/01/2019  Birth time:9:53 AM  Gender:Female  Living status:Living  Apgars:9, 9 Weight:  5'11, 2585g   Magnesium Sulfate received: No BMZ received: No Rhophylac:N/A MMR:N/A T-DaP:Given  prenatally Flu: No Transfusion:No  Physical exam  Vitals:   08/01/19 0709 08/01/19 0731  BP: 124/83 118/83  Pulse: 85   Resp: 18   Temp: 98.5 F (36.9 C)   TempSrc: Oral   SpO2: 99%   Weight: 73.9 kg   Height: _0  (1.702 m)    General: alert, cooperative and no distress Lochia: appropriate Uterine Fundus: firm Incision: Healing well with no significant drainage, dressing C/D/I DVT Evaluation: No evidence of DVT seen on physical exam. Labs: Lab Results  Component Value Date   WBC 12.5 (H) 08/01/2019   HGB 12.0 08/01/2019   HCT 35.5 (L) 08/01/2019   MCV 78.4 (L) 08/01/2019   PLT 174 08/01/2019   CMP Latest Ref Rng & Units 06/02/2019  Glucose 65 - 99 mg/dL 103(H)  BUN 6 - 20 mg/dL 7  Creatinine 0.57 - 1.00 mg/dL 0.62  Sodium 134 - 144 mmol/L 137  Potassium 3.5 - 5.2 mmol/L 3.4(L)  Chloride 96 - 106 mmol/L 101  CO2 20 - 29 mmol/L 19(L)  Calcium 8.7 - 10.2 mg/dL 9.1  Total Protein 6.0 - 8.5 g/dL 6.2  Total Bilirubin 0.0 - 1.2 mg/dL <0.2  Alkaline Phos 39 - 117 IU/L 84  AST 0 - 40 IU/L 14  ALT 0 - 32 IU/L 11   Edinburgh Score: Edinburgh Postnatal Depression Scale Screening Tool 02/07/2018  I have been able to laugh and  see the funny side of things. 1  I have looked forward with enjoyment to things. 0  I have blamed myself unnecessarily when things went wrong. 1  I have been anxious or worried for no good reason. 2  I have felt scared or panicky for no good reason. 2  Things have been getting on top of me. 1  I have been so unhappy that I have had difficulty sleeping. 1  I have felt sad or miserable. 1  I have been so unhappy that I have been crying. 1  The thought of harming myself has occurred to me. 0  Edinburgh Postnatal Depression Scale Total 10     After visit meds:  Allergies as of 08/03/2019      Reactions   Penicillins Hives   Has patient had a PCN reaction causing immediate rash, facial/tongue/throat swelling, SOB or lightheadedness with  hypotension: No Has patient had a PCN reaction causing severe rash involving mucus membranes or skin necrosis: No Has patient had a PCN reaction that required hospitalization No Has patient had a PCN reaction occurring within the last 10 years: Yes If all of the above answers are "NO", then may proceed with Cephalosporin use-----------Has taken Rocephin w/out reaction.    Orange Juice [orange Oil] Rash      Medication List    STOP taking these medications   aspirin 81 MG chewable tablet     TAKE these medications   ibuprofen 800 MG tablet Commonly known as: ADVIL Take 1 tablet (800 mg total) by mouth every 6 (six) hours.   oxyCODONE 5 MG immediate release tablet Commonly known as: Oxy IR/ROXICODONE Take 1 tablet (5 mg total) by mouth every 4 (four) hours as needed for breakthrough pain.   predniSONE 10 MG tablet Commonly known as: DELTASONE Take 2 tablets (20 mg total) by mouth daily for 4 days. What changed:   how much to take  when to take this   WesTab Plus 27-1 MG Tabs Take 1 tablet by mouth daily.        Discharge home in stable condition Infant Feeding: Breast Infant Disposition:home with mother Discharge instruction: per After Visit Summary and Postpartum booklet. Activity: Advance as tolerated. Pelvic rest for 6 weeks.  Diet: routine diet Future Appointments: Future Appointments  Date Time Provider [place]  08/04/2019  2:35 PM [facility]   Follow up Visit:  Please schedule this patient for a In person postpartum visit in 6 weeks with the following provider: Any provider. Additional Postpartum F/U:Incision check 1 week  High risk pregnancy complicated by: FGR with abnormal dopplers Delivery mode:  C-Section, Low Transverse  Anticipated Birth Control:  Depo (first dose given prior to discharge)  EMILY Madelin Headings, MD PGY-2 Resident Family Medicine 08/03/2019, 9:52 AM  Midwife attestation I have seen and examined this  patient and agree with above documentation in the resident's note.   Kaitlyn Howard is a 29 y.o. U2P5361 s/p RCS.  Pain is well controlled. Plan for birth control is Depo-Provera. Method of Feeding: breast  PE:  Gen: well appearing Heart: reg rate Lungs: normal WOB Fundus firm Ext: no pain, no edema  Recent Labs    08/01/19 0744 08/02/19 0516  HGB 12.0 11.3*  HCT 35.5* 32.8*    Assessment S/p Repeat LTCS POD # 2  Plan: - discharge home - postpartum care discussed - f/u in office in 1 week for incision check and 6 weeks for postpartum visit -  f/u with GI for Crohn dz, continue Prednisone  Julianne Handler, CNM 11:35 AM

## 2019-08-01 NOTE — Anesthesia Procedure Notes (Signed)
Spinal  Patient location during procedure: OB Start time: 08/01/2019 9:33 AM End time: 08/01/2019 9:37 AM Staffing Performed: anesthesiologist  Anesthesiologist: Barnet Glasgow, MD Preanesthetic Checklist Completed: patient identified, IV checked, risks and benefits discussed, surgical consent, monitors and equipment checked, pre-op evaluation and timeout performed Spinal Block Patient position: sitting Prep: DuraPrep and site prepped and draped Patient monitoring: heart rate, cardiac monitor, continuous pulse ox and blood pressure Approach: midline Location: L3-4 Injection technique: single-shot Needle Needle type: Pencan  Needle gauge: 24 G Needle length: 10 cm Assessment Sensory level: T4 Additional Notes 1 Attempt (s). Pt tolerated procedure well.

## 2019-08-01 NOTE — Transfer of Care (Signed)
Immediate Anesthesia Transfer of Care Note  Patient: Kaitlyn Howard  Procedure(s) Performed: CESAREAN SECTION (N/A Abdomen)  Patient Location: PACU  Anesthesia Type:Spinal  Level of Consciousness: awake, alert  and oriented  Airway & Oxygen Therapy: Patient Spontanous Breathing  Post-op Assessment: Report given to RN and Post -op Vital signs reviewed and stable  Post vital signs: Reviewed and stable  Last Vitals:  Vitals Value Taken Time  BP 105/74 08/01/19 1130  Temp 36.4 C 08/01/19 1115  Pulse 76 08/01/19 1138  Resp 23 08/01/19 1138  SpO2 100 % 08/01/19 1138  Vitals shown include unvalidated device data.  Last Pain:  Vitals:   08/01/19 1115  TempSrc: Oral         Complications: No complications documented.

## 2019-08-02 LAB — CBC
HCT: 32.8 % — ABNORMAL LOW (ref 36.0–46.0)
Hemoglobin: 11.3 g/dL — ABNORMAL LOW (ref 12.0–15.0)
MCH: 26.7 pg (ref 26.0–34.0)
MCHC: 34.5 g/dL (ref 30.0–36.0)
MCV: 77.4 fL — ABNORMAL LOW (ref 80.0–100.0)
Platelets: 161 10*3/uL (ref 150–400)
RBC: 4.24 MIL/uL (ref 3.87–5.11)
RDW: 15 % (ref 11.5–15.5)
WBC: 16.3 10*3/uL — ABNORMAL HIGH (ref 4.0–10.5)
nRBC: 0 % (ref 0.0–0.2)

## 2019-08-02 NOTE — Progress Notes (Signed)
Post Op Day 1  Subjective:  Kaitlyn Howard is a 29 y.o. [id number] 44w4ds/p repeat C/S with FGR and abnormal dopplers.  No acute events overnight.  Pt denies problems with ambulating, voiding or po intake.  She denies nausea or vomiting.  Pain is well controlled.  She has had flatus. She has not had bowel movement.  Lochia Small.  Plan for birth control is Depo-Provera.  Method of Feeding: breast.  Objective: BP 109/72 (BP Location: Right Arm)   Pulse 81   Temp 98 F (36.7 C) (Oral)   Resp 18   Ht 5' 7"  (1.702 m)   Wt 73.9 kg   SpO2 100%   Breastfeeding Unknown   BMI 25.53 kg/m   Physical Exam:  General: alert, cooperative and no distress Lochia:normal flow Chest: CTAB Heart: RRR no m/r/g Abdomen: +BS, soft, nontender, fundus firm at/below umbilicus Uterine Fundus: firm DVT Evaluation: No evidence of DVT seen on physical exam. Extremities: no LE edema  Recent Labs    08/01/19 0744 08/02/19 0516  HGB 12.0 11.3*  HCT 35.5* 32.8*    Assessment/Plan:  ASSESSMENT: Kaitlyn Twomblyis a 29y.o. [id number] #1 s/p repeat c/section, doing well.   Routine PP care  Crohn's: continue with 4074maily prednisone, day 2 of 7; has only had 44m66m 40mg58ming Dispo: patient consider discharge home in 1-2 days   LOS: 1 day   MakieStark Klein/2021, 7:59 AM

## 2019-08-02 NOTE — Clinical Social Work Maternal (Signed)
CLINICAL SOCIAL WORK MATERNAL/CHILD NOTE  Patient Details  Name: Kaitlyn Howard MRN: [id number] Date of Birth: 08/01/2018  Date:  08/02/2019  Clinical Social Worker Initiating Note:  Corneisha Alvi, MSW, LCSW-A Date/Time: Initiated:  08/02/19/1226     Child's Name:  Kaitlyn Howard   Biological Parents:  Mother, Father   Need for Interpreter:  None   Reason for Referral:  Current Substance Use/Substance Use During Pregnancy    Address:  [address] [place] [address]    Phone number:  [phone] (home)     Additional phone number:   Household Members/Support Persons (HM/SP):   Household Member/Support Person 1, Household Member/Support Person 2, Household Member/Support Person 3   HM/SP Name Relationship DOB or Age  HM/SP -1 Kaitlyn Howard Spouse 29  HM/SP -2 Kaitlyn Howard Child 17 months  HM/SP -3 Kaitlyn Howard Child 9 years  HM/SP -4        HM/SP -5        HM/SP -6        HM/SP -7        HM/SP -8          Natural Supports (not living in the home):  Extended Family, Friends, Immediate Family, Neighbors   Professional Supports: None   Employment: Full-time   Type of Work: Customer Service at [employer]   Education:  High school graduate   Homebound arranged:    Financial Resources:  Medicaid   Other Resources:  Food Stamps , WIC   Cultural/Religious Considerations Which May Impact Care:  None  Strengths:  Ability to meet basic needs , Home prepared for child , Pediatrician chosen   Psychotropic Medications:         Pediatrician:    [place] area  Pediatrician List:   [facility]  [place]    [place]    [place]    [place]    [place]      Pediatrician Fax Number:    Risk Factors/Current Problems:  None   Cognitive State:  Alert    Mood/Affect:  Comfortable , Calm , Bright , Happy , Interested    CSW Assessment: CSW received consult for MOB due to  history of substance use and positive UDS for newborn. CSW met with MOB, FOB Kaitlyn, and newborn Kaitlyn at bedside to complete discussion. CSW obtained permission from MOB to speak with FOB present. MOB confirmed her history of marijuana use but states her last use was three weeks ago. MOB denies any other substance use. CSW educated MOB and FOB on hospital drug screening policies and mandated reporting requirements. All questions answered regarding mandated reporting for substance exposure. MOB denies any history of CPS involvement.   MOB states she has a car seat for safe transportation with knowledge of installation and use. MOB reports the infant will sleep in a crib at home. MOB, FOB, and CSW discussed SIDS precautions and safe sleeping techniques. MOB reports the infant will receive pediatric care at [facility]. MOB reports she has no history of mental health diagnoses. MOB reports she is a high school graduate who currently works full time at [employer] on [address]. MOB reports she has all items at home needed for newborn care. MOB reports she receives WIC and food stamps.   CSW answered all of MOB and FOB's questions regarding the mandated CPS report - no additional concerns to address. CSW made CPS report to [place] CPS -   currently, there are no barriers to discharge.  CSW Plan/Description:  CSW Will Continue to Monitor Umbilical Cord Tissue Drug Screen Results and Make Report if Warranted    Ruhi Kopke L Jahree Dermody, LCSW 08/02/2019, 12:31 PM  

## 2019-08-02 NOTE — Lactation Note (Signed)
This note was copied from a baby's chart. Lactation Consultation Note  Patient Name: Boy Carrah Eppolito IRJJO'A Date: 08/02/2019 Reason for consult: Initial assessment;1st time breastfeeding;Infant < 6lbs;Early term 37-38.6wks  LC in to visit with P3 Mom of ET infant at 29 hrs old.  Baby at 4% weight loss and has been exclusively breastfeeding.  Baby has breastfed 11 times in the last 24 hrs.  Mom has baby STS.   5 voids and 2 stools last 24 hrs.  Mom has a history of pumping for her 2nd child who was born at 42 weeks and spent 9 days in the NICU.  He is 3 months old now.  She pumped and bottle fed him for 1 month.  Mom is committed to breastfeeding baby, and for longer duration.   Mom has a pump set up in room, but has only pumped once and one breast at a time.  Talked about double pumping and importance of supporting her milk supply.  Mom has [place] faxed referral for pump at discharge.  Disassembled pump parts and washed, rinsed and set in separate bin to dry, demonstrating this to parents.  Plan- 1- Keep baby STS 2- offer breast with cues, making sure baby is latched deeply on breast, use breast compression to increase milk transfer 3- Pump both breasts after breastfeeding 15 mins  4- feed baby any EBM pumped or hand expressed. 5- ask for help prn  Mom aware of OP lactation support available to her.  Recommend OP lactation follow-up.  LC to send message to OP clinic on discharge. Lactation brochure left in room.  Mom aware of 2 phone numbers on back of brochure.  Feeding Feeding Type: Breast Fed  LATCH Score Latch: Grasps breast easily, tongue down, lips flanged, rhythmical sucking.  Audible Swallowing: Spontaneous and intermittent  Type of Nipple: Everted at rest and after stimulation  Comfort (Breast/Nipple): Soft / non-tender  Hold (Positioning): Assistance needed to correctly position infant at breast and maintain latch.  LATCH Score: 9  Interventions Interventions:  Breast feeding basics reviewed;Assisted with latch;Skin to skin;Breast massage;Hand express;Breast compression;Adjust position;Support pillows;Position options;Expressed milk;DEBP  Lactation Tools Discussed/Used Tools: Pump Breast pump type: Double-Electric Breast Pump WIC Program: Yes Pump Review: Setup, frequency, and cleaning;Milk Storage Initiated by:: RN Date initiated:: 08/02/19   Consult Status Consult Status: Follow-up Date: 08/03/19 Follow-up type: In-patient    Broadus John 08/02/2019, 1:10 PM

## 2019-08-03 MED ORDER — PREDNISONE 10 MG PO TABS
20.0000 mg | ORAL_TABLET | Freq: Every day | ORAL | 0 refills | Status: AC
Start: 2019-08-03 — End: 2019-08-07

## 2019-08-03 MED ORDER — PREDNISONE 10 MG PO TABS
10.0000 mg | ORAL_TABLET | Freq: Every day | ORAL | 0 refills | Status: DC
Start: 2019-08-03 — End: 2019-08-03

## 2019-08-03 MED ORDER — HYPROMELLOSE (GONIOSCOPIC) 2.5 % OP SOLN
1.0000 [drp] | Freq: Four times a day (QID) | OPHTHALMIC | Status: DC | PRN
  Filled 2019-08-03: qty 15

## 2019-08-03 MED ORDER — PREDNISONE 10 MG PO TABS: 20.0000 mg | ORAL_TABLET | Freq: Every day | ORAL | 0 refills | Status: DC

## 2019-08-03 MED ORDER — WESTAB PLUS 27-1 MG PO TABS: 1.0000 | ORAL_TABLET | Freq: Every day | ORAL | 1 refills | Status: AC

## 2019-08-03 MED ORDER — OXYCODONE HCL 5 MG PO TABS: 5.0000 mg | ORAL_TABLET | ORAL | 0 refills | Status: DC | PRN

## 2019-08-03 MED ORDER — POLYVINYL ALCOHOL 1.4 % OP SOLN
1.0000 [drp] | Freq: Four times a day (QID) | OPHTHALMIC | Status: DC | PRN
  Administered 2019-08-03: 1 [drp] via OPHTHALMIC
  Filled 2019-08-03: qty 15

## 2019-08-03 MED ORDER — IBUPROFEN 800 MG PO TABS: 800.0000 mg | ORAL_TABLET | Freq: Four times a day (QID) | ORAL | 0 refills | Status: AC

## 2019-08-04 ENCOUNTER — Encounter: Payer: Medicaid Other | Admitting: Medical

## 2019-08-06 ENCOUNTER — Other Ambulatory Visit (INDEPENDENT_AMBULATORY_CARE_PROVIDER_SITE_OTHER): Payer: Self-pay

## 2019-08-07 ENCOUNTER — Other Ambulatory Visit: Payer: Self-pay

## 2019-08-07 ENCOUNTER — Ambulatory Visit (INDEPENDENT_AMBULATORY_CARE_PROVIDER_SITE_OTHER): Payer: Medicaid Other | Admitting: Lactation Services

## 2019-08-07 ENCOUNTER — Ambulatory Visit: Payer: Medicaid Other

## 2019-08-07 VITALS — BP 136/92 | Wt 157.4 lb

## 2019-08-07 DIAGNOSIS — Z5189 Encounter for other specified aftercare: Secondary | ICD-10-CM

## 2019-08-07 DIAGNOSIS — Z98891 History of uterine scar from previous surgery: Secondary | ICD-10-CM

## 2019-08-07 MED ORDER — OXYCODONE HCL 5 MG PO TABS: 5.0000 mg | ORAL_TABLET | ORAL | 0 refills | Status: AC | PRN

## 2019-08-07 MED ORDER — NIFEDIPINE ER OSMOTIC RELEASE 30 MG PO TB24: 30.0000 mg | ORAL_TABLET | Freq: Every day | ORAL | 2 refills | Status: DC

## 2019-08-07 MED ORDER — NIFEDIPINE ER OSMOTIC RELEASE 30 MG PO TB24: 30.0000 mg | ORAL_TABLET | Freq: Every day | ORAL | 2 refills | Status: AC

## 2019-08-07 NOTE — Progress Notes (Signed)
Patient was assessed and managed by nursing staff during this encounter. I have reviewed the chart and agree with the documentation and plan. I have also made any necessary editorial changes.  Verita Schneiders, MD 08/07/2019 12:13 PM

## 2019-08-07 NOTE — Progress Notes (Signed)
Patient was assessed and managed by nursing staff during this encounter. I have reviewed the chart and agree with the documentation and plan. I have also made any necessary editorial changes.  Verita Schneiders, MD 08/07/2019 6:05 PM

## 2019-08-07 NOTE — Progress Notes (Signed)
Patient in for wound check. She is requesting more Oxy for c/s incision pain. She is seen today at request of Julianne Handler, CNM.   Patient has a 28 yo and is having difficulty with her Crohns and having to go to the bathroom often and straining with it.   She is taking the Oxy 2 pills every 4-7 hours and taking Ibuprofen every 6 hours.   She reports she cannot take Tylenol as it causes Rectal Bleeding.   BP- 136/94 She has a history of Pre E with last pregnancy. Her 28 yo is busy and the reason mom is not able to rest as much as needed. She has intermittent headaches, no blurred vision or dizziness. Repeat blood pressure 136/92. No swelling noted in patient.   Patient describes pain to incision is a burning pain to the right side of the incision deep in the tissue. . She reports the Oxy Codone helps. Honeycomb dressing and Steri Strips are off. Incision is clean, dry and well approximated. Patient aware of proper would care. Reviewed with patient that burning is a common complaint after c/s.   She reports she is not able to sit and rest much as she has a 28 yo at home.   Spoke with Dr. Odis Luster in regards to  BP and meds. Orders placed. Patient to follow up next Tuesday or Wednesday for BP check as started on Procardia today, advised patient she can bring the baby with her. Patient left before being scheduled. [place] office to call and get her scheduled.

## 2019-08-07 NOTE — Progress Notes (Signed)
   Patient seen in the office today for would check at request of Burnett Harry, CNM for pain with c/s site.   Patient reports pain to the right of her incision deep in the tissue right at the incision site. She reports she has been taking Oxycodone 2 tablets every 4-7 hours. She reports she is taking Ibuprofen every 6 hours.   She reports she has Crohns and is not able to take Tylenol as it causes Rectal Bleeding. She is not willing to take Tylenol despite being informed it does not usually cause rectal bleeding. She reports she has to go the bathroom often and is prone to straining.   Incision dressing and steristrips has been removed by patient. She is aware of wound care. Incision is clean, dry and well approximated. No redness, warmth or swelling noted.   BP 143/95 with repeats of 136/94 and 136/92. Patient with history of Preeclampsia with 1st pregnancy. She reports occasional headache and not blurred vision, dizziness or edema. Reported to Dr. Harolyn Rutherford who ordered Procardia and Oxycodone for patient. Patient was informed that she needs to return next Tuesday or Wednesday for BP check, patient left without scheduling the appt as she needed to care for her newborn. Advised patient she can bring baby with her next week.

## 2019-08-11 ENCOUNTER — Inpatient Hospital Stay (HOSPITAL_COMMUNITY): Admission: RE | Admit: 2019-08-11 | Payer: Medicaid Other | Source: Ambulatory Visit

## 2019-08-12 ENCOUNTER — Ambulatory Visit: Payer: Medicaid Other

## 2019-08-13 ENCOUNTER — Inpatient Hospital Stay (HOSPITAL_COMMUNITY): Admission: RE | Admit: 2019-08-13 | Payer: Medicaid Other | Source: Home / Self Care | Admitting: Family Medicine

## 2019-08-13 ENCOUNTER — Ambulatory Visit: Payer: Medicaid Other

## 2019-08-13 ENCOUNTER — Encounter (HOSPITAL_COMMUNITY): Admission: RE | Payer: Self-pay | Source: Home / Self Care

## 2019-08-13 SURGERY — Surgical Case
Anesthesia: Choice

## 2019-09-07 ENCOUNTER — Ambulatory Visit: Payer: Medicaid Other | Admitting: Obstetrics and Gynecology

## 2019-10-24 IMAGING — US US MFM UA CORD DOPPLER
1 series · 12 of 28 positions shown · non-contrast
Comparison: none

[Series 1: us mfm ua cord doppler · 35 acquisitions, 12 frames shown]
[im 2/35]
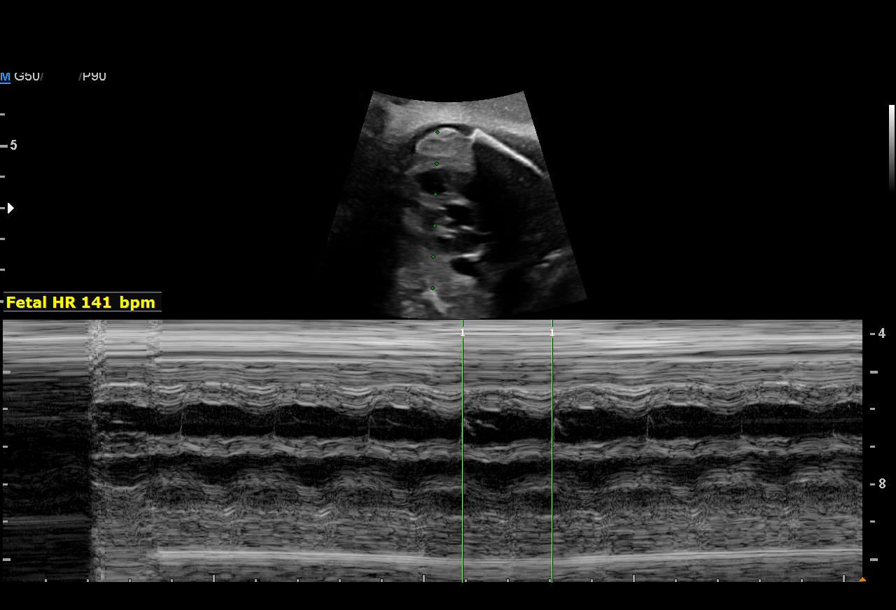
[im 4/35]
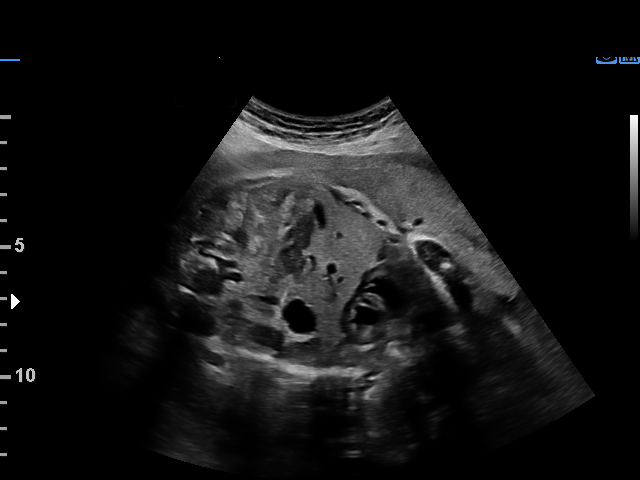
[im 7/35]
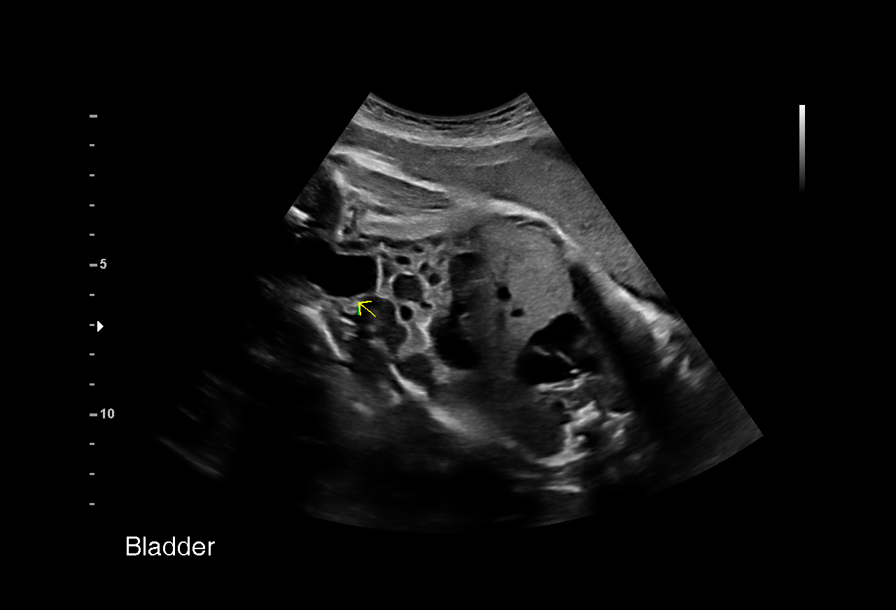
[im 11/35]
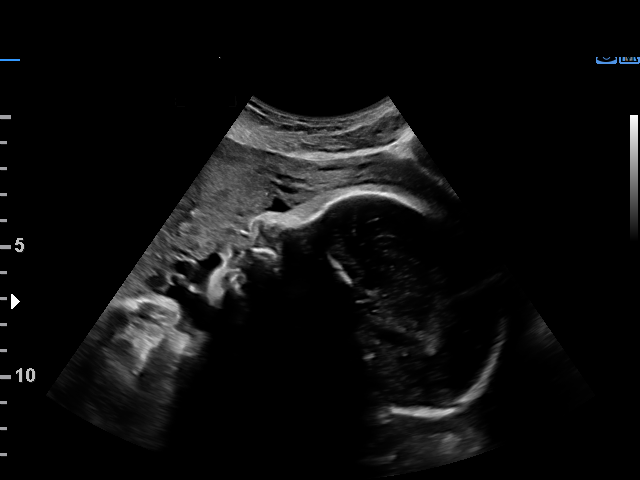
[im 13/35]
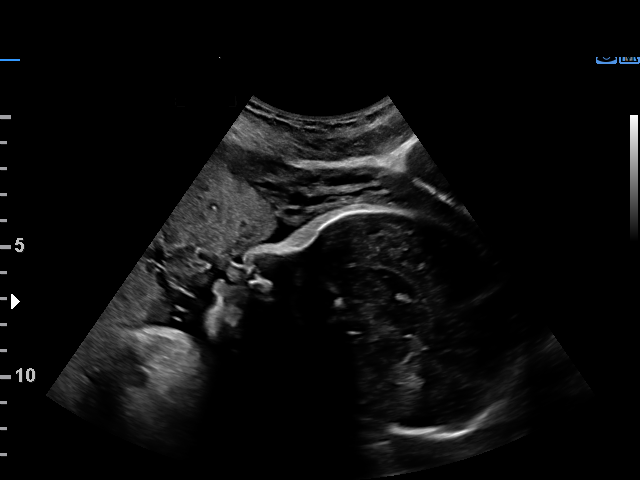
[im 16/35]
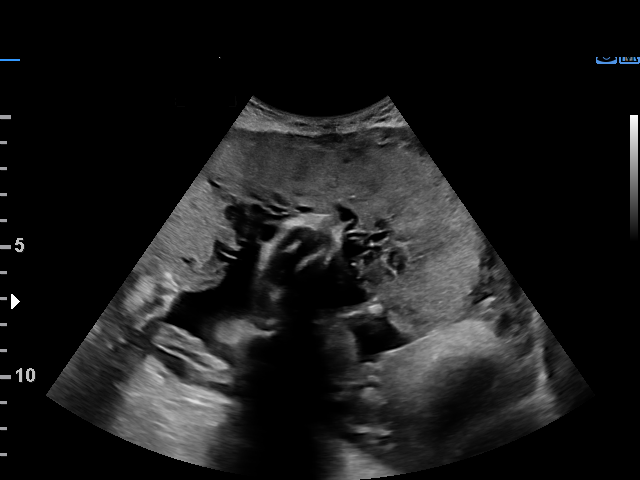
[im 19/35]
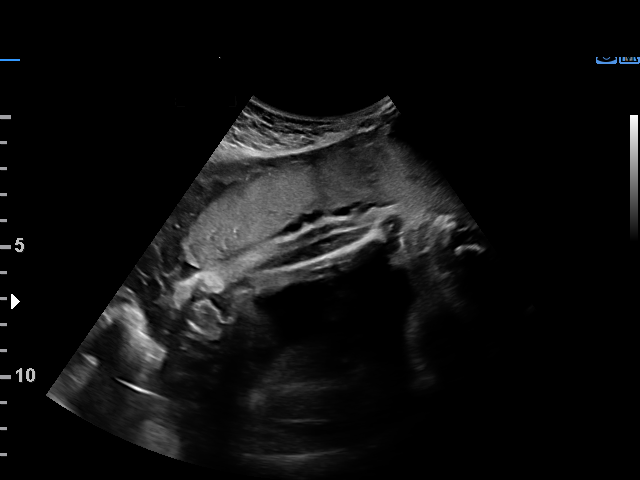
[im 22/35]
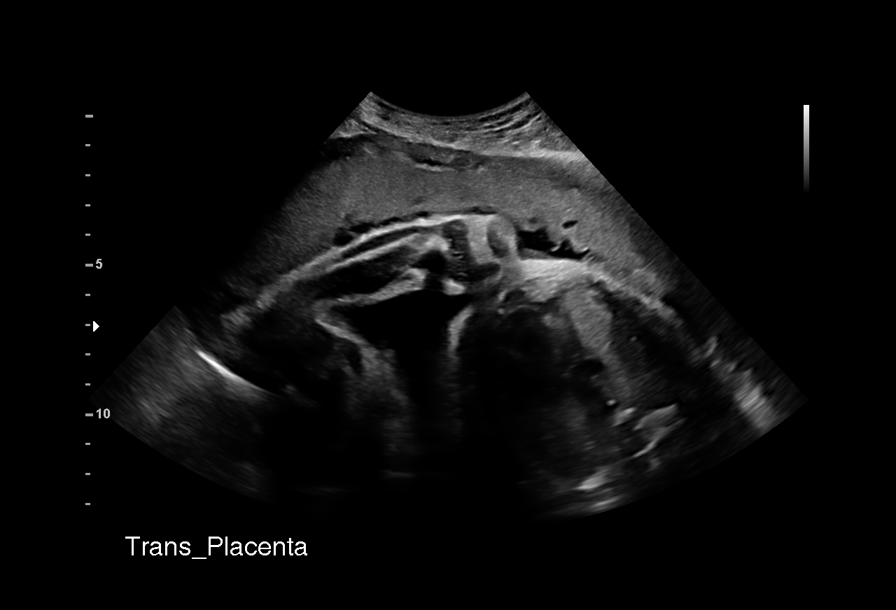
[im 24/35]
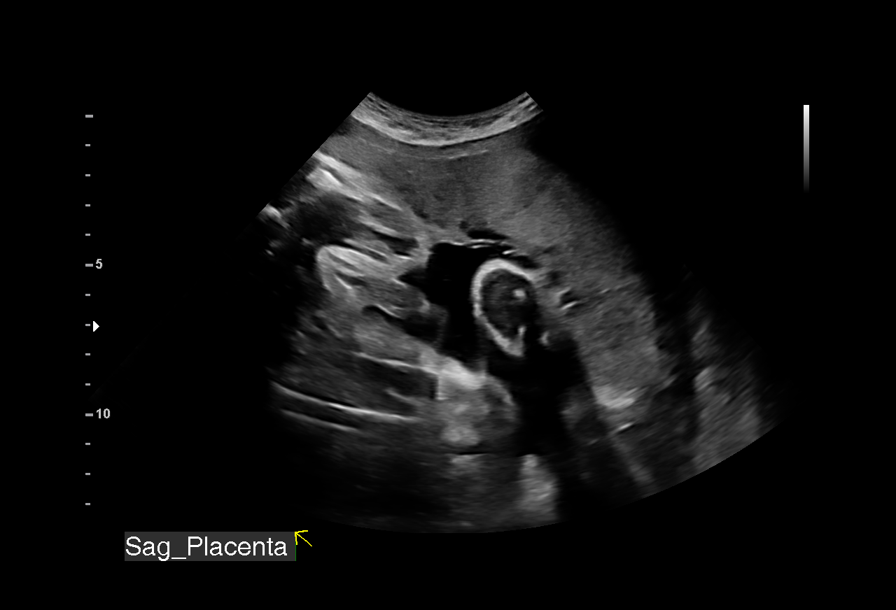
[im 28/35]
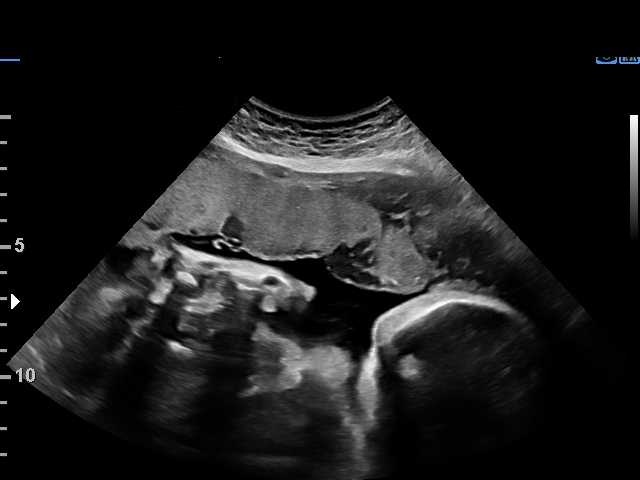
[im 31/35]
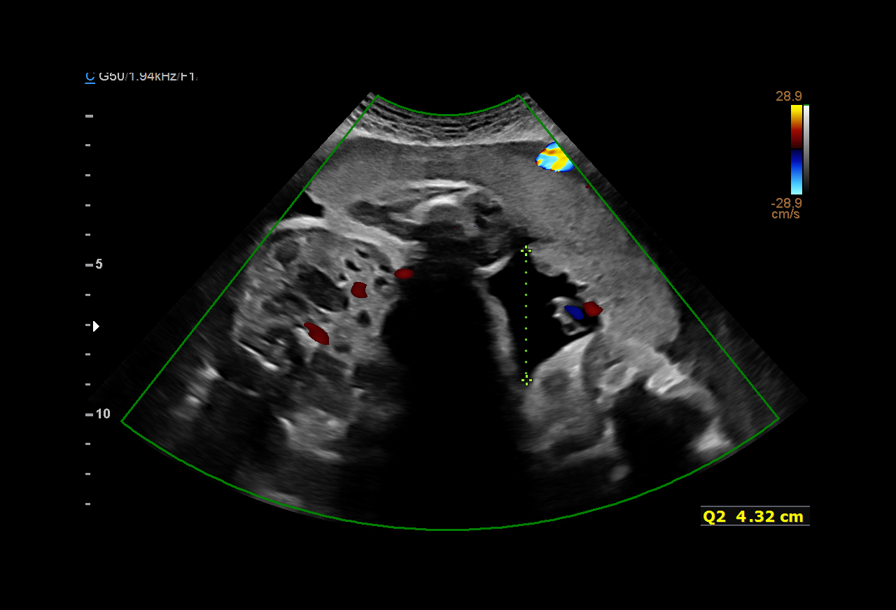
[im 33/35]
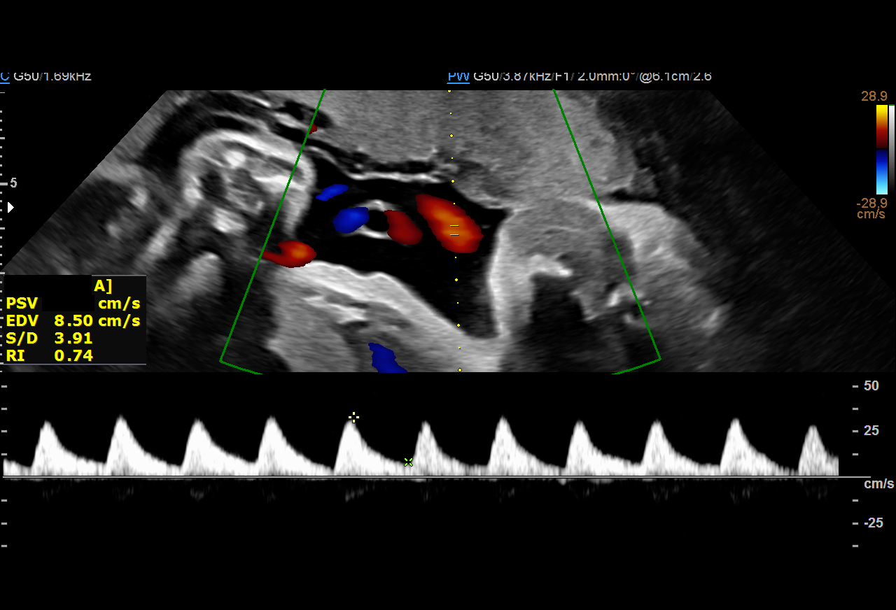

[12 of 28 positions shown; findings below may reference images not displayed]

OB/Gyn and
                                                            Infertility
                   OB/Gyn

  1  US MFM OB LIMITED                    76815.01     FELNER CEOLA
  3  US MFM UA CORD DOPPLER               76820.02     FELNER CEOLA
 ----------------------------------------------------------------------

 ----------------------------------------------------------------------
Indications

  Maternal care for known or suspected poor
  fetal growth, third trimester, not applicable or
  unspecified
  35 weeks gestation of pregnancy
  Previous cesarean delivery, antepartum
  Poor obstetric history: Previous
  preeclampsia / eclampsia/gestational HTN
  History of sickle cell trait
  Smoking complicating pregnancy, third
  trimester (quit approx 1 mo ago)
  Medical complication of pregnancy (Hx
  Crohn's)
 ----------------------------------------------------------------------
Vital Signs

 BMI:         26.47        Pulse:  73
 BP:          125/76
Fetal Evaluation

 Num Of Fetuses:         1
 Fetal Heart Rate(bpm):  141
 Cardiac Activity:       Observed
 Presentation:           Cephalic
 Placenta:               Anterior
 P. Cord Insertion:      Previously Visualized

 Amniotic Fluid
 AFI FV:      Within normal limits

 AFI Sum(cm)     %Tile       Largest Pocket(cm)
 11.89           35

 RUQ(cm)       RLQ(cm)       LUQ(cm)        LLQ(cm)

Biophysical Evaluation

 Amniotic F.V:   Within normal limits       F. Tone:        Observed
 F. Movement:    Observed                   Score:          [DATE]
 F. Breathing:   Observed
Gestational Age

 Clinical EDD:  35w 2d                                        EDD:   03/05/18
 Best:          35w 2d     Det. By:  Clinical EDD             EDD:   03/05/18
Doppler - Fetal Vessels

 Umbilical Artery
  S/D     %tile                                            ADFV    RDFV
 3.79       97                                                No      No

Cervix Uterus Adnexa

 Cervix
 Not visualized (advanced GA >70wks)
Comments

 U/S images reviewed. Findings reviewed with patient.   No
 evidence of fetal compromise is found on BPP today.  UA
 dopplers are elevated.  AFI is normal.
  Questions answered.
 10 minutes spent face to face with patient.
 Recommendations: 1) Twice weekly A-P surveillance with
 NST by OB on [REDACTED]s and BPP with UA dopplers on
 [REDACTED]s  2) Delivery @ 37 weeks
Recommendations

 1) Twice weekly A-P surveillance with NST by OB on
 [REDACTED]s and BPP with UA dopplers on [REDACTED]s  2) Delivery
 @ 37 weeks

              [place], Tiho Tiho

## 2019-10-28 ENCOUNTER — Ambulatory Visit (INDEPENDENT_AMBULATORY_CARE_PROVIDER_SITE_OTHER): Payer: Medicaid Other | Admitting: General Practice

## 2019-10-28 ENCOUNTER — Encounter: Payer: Self-pay | Admitting: General Practice

## 2019-10-28 ENCOUNTER — Other Ambulatory Visit: Payer: Self-pay

## 2019-10-28 VITALS — BP 124/82 | HR 82 | Ht 66.0 in | Wt 155.0 lb

## 2019-10-28 DIAGNOSIS — Z3042 Encounter for surveillance of injectable contraceptive: Secondary | ICD-10-CM | POA: Diagnosis not present

## 2019-10-28 MED ORDER — MEDROXYPROGESTERONE ACETATE 150 MG/ML IM SUSP
150.0000 mg | Freq: Once | INTRAMUSCULAR | Status: AC
  Administered 2019-10-28: 150 mg via INTRAMUSCULAR

## 2019-10-28 NOTE — Progress Notes (Signed)
Tanzie Beste here for Depo-Provera  Injection.  Injection administered without complication. Patient will return in 3 months for next injection. Patient will need annual with pap before next depo injection.  Derinda Late, RN 10/28/2019  10:57 AM

## 2019-11-03 ENCOUNTER — Other Ambulatory Visit (HOSPITAL_COMMUNITY)
Admission: RE | Admit: 2019-11-03 | Discharge: 2019-11-03 | Disposition: A | Discharge status: Home / Self Care | Payer: Medicaid Other | Source: Ambulatory Visit | Attending: Obstetrics & Gynecology | Admitting: Obstetrics & Gynecology

## 2019-11-03 ENCOUNTER — Other Ambulatory Visit: Payer: Self-pay

## 2019-11-03 ENCOUNTER — Ambulatory Visit (INDEPENDENT_AMBULATORY_CARE_PROVIDER_SITE_OTHER): Payer: Medicaid Other | Admitting: General Practice

## 2019-11-03 VITALS — BP 132/88 | HR 101 | Ht 66.0 in | Wt 155.0 lb

## 2019-11-03 DIAGNOSIS — Z113 Encounter for screening for infections with a predominantly sexual mode of transmission: Secondary | ICD-10-CM | POA: Insufficient documentation

## 2019-11-03 NOTE — Progress Notes (Signed)
Patient presents to office today requesting STD testing as she is concerned about a previous partner. Denies any symptoms or abnormal vaginal discharge. Patient was instructed in self swab & specimen was collected. Labs drawn as well. Discussed all results should be back by tomorrow and will be available via mychart. Also discussed any needed prescriptions would be sent to the pharmacy. Patient verbalized understanding.  Koren Bound RN BSN 11/03/19

## 2019-11-03 NOTE — Progress Notes (Signed)
Patient was assessed and managed by nursing staff during this encounter. I have reviewed the chart and agree with the documentation and plan. I have also made any necessary editorial changes.  Clarisa Fling, NP 11/03/2019 9:23 AM

## 2019-11-04 LAB — RPR: RPR Ser Ql: NONREACTIVE

## 2019-11-04 LAB — HEPATITIS C ANTIBODY: Hep C Virus Ab: <0.1 s/co ratio (ref 0.0–0.9)

## 2019-11-04 LAB — HIV ANTIBODY (ROUTINE TESTING W REFLEX): HIV Screen 4th Generation wRfx: NONREACTIVE

## 2019-11-06 LAB — CERVICOVAGINAL ANCILLARY ONLY
Chlamydia: NEGATIVE
Comment: NEGATIVE
Comment: NEGATIVE
Comment: NORMAL
Neisseria Gonorrhea: POSITIVE — AB
Trichomonas: NEGATIVE

## 2019-11-09 ENCOUNTER — Other Ambulatory Visit: Payer: Self-pay

## 2019-11-09 ENCOUNTER — Ambulatory Visit (INDEPENDENT_AMBULATORY_CARE_PROVIDER_SITE_OTHER): Payer: Medicaid Other | Admitting: General Practice

## 2019-11-09 ENCOUNTER — Telehealth: Payer: Self-pay | Admitting: Obstetrics & Gynecology

## 2019-11-09 VITALS — BP 129/86 | HR 79 | Ht 67.0 in | Wt 151.0 lb

## 2019-11-09 DIAGNOSIS — A549 Gonococcal infection, unspecified: Secondary | ICD-10-CM | POA: Diagnosis not present

## 2019-11-09 MED ORDER — CEFTRIAXONE SODIUM 500 MG IJ SOLR
500.0000 mg | Freq: Once | INTRAMUSCULAR | Status: AC
  Administered 2019-11-09: 500 mg via INTRAMUSCULAR

## 2019-11-09 NOTE — Progress Notes (Signed)
Patient presents to office today for treatment of recent + gonorrhea. Per chart review, patient has allergic reaction to penicillin. Patient states she has had penicillin recently and didn't have a reaction. Per chart review, patient has also had rocephin in the past. Rocephin 500 mg IM given without complication. Patient requests to return in 3 weeks for test of cure. Communicable disease card completed & faxed.  Koren Bound RN BSN 11/09/19

## 2019-11-09 NOTE — Telephone Encounter (Addendum)
I called pt and discussed her concerns. She stated that she has seen the test results showing +Gonorrhea on her MyChart. Pt was advised she will need antibiotic injection for treatment of GC and will also be given antibiotic for Chlamydia even though she tested negative for that infection  She is able to come in today and was given appt for 1:30 today.

## 2019-11-09 NOTE — Telephone Encounter (Signed)
Patient had some test done over the weekend, and she would like a call back from the nurse ASAP about her results.

## 2019-11-10 ENCOUNTER — Telehealth: Payer: Self-pay | Admitting: General Practice

## 2019-11-10 NOTE — Progress Notes (Signed)
Chart reviewed - agree with CMA/RN documentation.

## 2019-11-10 NOTE — Telephone Encounter (Signed)
Patient called into front office concerned she may need another prescription for treatment of gonorrhea. Patient states she remembers [place] talking about penicillin the day before. Discussed with patient penicillin isn't a treatment for gonorrhea- we were discussing penicillin because she has an allergy to it and because of that there was the potential for a reaction to the medication I gave her yesterday. Told patient pills used to also be recommended in treatment of gonorrhea but no longer are especially in light of her negative chlamydia test. Reassured patient the injection she was given yesterday is all she needs for treatment and is the recommended medication for treating gonorrhea. Patient verbalized understanding.

## 2019-11-17 ENCOUNTER — Ambulatory Visit: Payer: Self-pay

## 2019-11-17 NOTE — Telephone Encounter (Signed)
  Reason for Disposition . General information question, no triage required and triager able to answer question  Answer Assessment - Initial Assessment Questions 1. REASON FOR CALL or QUESTION: "What is your reason for calling today?" or "How can I best help you?" or "What question do you have that I can help answer?"     Message from [facility] sent at 11/17/2019 3:34 PM EDT  Pt requests to speak with a nurse to discuss medication. Pt did not provide name of medication she just insisted on speaking with a nurse because she has questions about medications. Cb# [phone]  Protocols used: INFORMATION ONLY CALL - NO TRIAGE-A-AH

## 2019-11-17 NOTE — Telephone Encounter (Signed)
Called pt back and she was asking specific questions regarding doxycycline. She stated her boyfriend was seen for groin pain and was prescribed doxycycline. Pt asking if they prescribe for STD's. Advised pt that yes it can be prescribed for STD's. Also advised pt that they can prescribe for UTI's, acne, RMSF and other health care issues. Pt verbalized understanding.

## 2019-11-19 ENCOUNTER — Telehealth (INDEPENDENT_AMBULATORY_CARE_PROVIDER_SITE_OTHER): Payer: Medicaid Other | Admitting: Lactation Services

## 2019-11-19 ENCOUNTER — Telehealth: Payer: Self-pay | Admitting: Lactation Services

## 2019-11-19 DIAGNOSIS — A549 Gonococcal infection, unspecified: Secondary | ICD-10-CM

## 2019-11-19 NOTE — Telephone Encounter (Signed)
Returned patients call. She did not answer. LM for patient to call the office at her convenience or send in My Chart message with concerns.

## 2019-11-19 NOTE — Telephone Encounter (Signed)
Patient called back in and asked for nurse to call back. Called patient back.   She reports she has had sex with her boyfriend after he had been treated for 5 days even though patient was informed to abstain for longer.   Spoke with Gavin Pound, CNM who recommends that she come in in about a week to be retested. Patient has an appt on 10/18 for TOC.   Patient reports she plans to go to the ED to get another shot. Informed her we do not recommend she go to the ED due to potential for COVID exposure. She reports she plans to go to an Urgent Care. Recommended she wait to come in on 10/18 for retest before getting another treatment, she reports she is going to Urgent Care anyway. Reviewed with patient that resting now may still be negative as she was recently treated but may show up positive at a later time.   Clarified with patient that she should abstain from further sexual intercourse until further testing has been completed. Patient voiced understanding.

## 2019-11-30 ENCOUNTER — Encounter: Payer: Self-pay | Admitting: *Deleted

## 2019-11-30 ENCOUNTER — Other Ambulatory Visit (HOSPITAL_COMMUNITY)
Admission: RE | Admit: 2019-11-30 | Discharge: 2019-11-30 | Disposition: A | Discharge status: Home / Self Care | Payer: Medicaid Other | Source: Ambulatory Visit | Attending: Obstetrics and Gynecology | Admitting: Obstetrics and Gynecology

## 2019-11-30 ENCOUNTER — Other Ambulatory Visit: Payer: Self-pay

## 2019-11-30 ENCOUNTER — Ambulatory Visit (INDEPENDENT_AMBULATORY_CARE_PROVIDER_SITE_OTHER): Payer: Medicaid Other | Admitting: *Deleted

## 2019-11-30 VITALS — BP 148/77 | HR 98 | Ht 67.0 in | Wt 150.0 lb

## 2019-11-30 DIAGNOSIS — Z202 Contact with and (suspected) exposure to infections with a predominantly sexual mode of transmission: Secondary | ICD-10-CM | POA: Insufficient documentation

## 2019-11-30 MED ORDER — CEFTRIAXONE SODIUM 500 MG IJ SOLR
500.0000 mg | Freq: Once | INTRAMUSCULAR | Status: AC
Start: 2019-11-30 — End: 2019-11-30
  Administered 2019-11-30: 500 mg via INTRAMUSCULAR

## 2019-11-30 NOTE — Progress Notes (Signed)
Pt states that she feels she has become re-infected with Gonorrhea. She had unprotected intercourse with her partner 5 days after he received treatment. Her symptoms are: white/yellow vaginal discharge, dysuria, frequent abdominal spasms. Per chart review, pt also did not keep PP appointment and has not been taking Nifedipine as prescribed - took for one week after delivery only. BP - 148/77, P - 98. She denies H/A or visual disturbances. Consult completed with Darrol Poke. Rocephin 500 mg ordered and administered. Self swab was requested and performed by pt so that she can inform her partner of the results. Pt was advised she will need test of cure swab in one month. Pt was also advised that she needs follow up with PCP in regards to her BP. She voiced understanding and stated that she has a scheduled appointment on 11/1.

## 2019-12-01 LAB — CERVICOVAGINAL ANCILLARY ONLY
Bacterial Vaginitis (gardnerella): NEGATIVE
Candida Glabrata: NEGATIVE
Candida Vaginitis: NEGATIVE
Chlamydia: NEGATIVE
Comment: NEGATIVE
Comment: NEGATIVE
Comment: NEGATIVE
Comment: NEGATIVE
Comment: NEGATIVE
Comment: NORMAL
Neisseria Gonorrhea: NEGATIVE
Trichomonas: NEGATIVE

## 2019-12-01 NOTE — Progress Notes (Signed)
Patient was assessed and managed by nursing staff during this encounter. I have reviewed the chart and agree with the documentation and plan. I have also made any necessary editorial changes.  Lajean Manes, CNM 12/01/2019 10:39 AM

## 2019-12-24 ENCOUNTER — Ambulatory Visit: Payer: Medicaid Other | Admitting: Obstetrics & Gynecology

## 2019-12-24 ENCOUNTER — Encounter: Payer: Self-pay | Admitting: Obstetrics & Gynecology

## 2019-12-31 ENCOUNTER — Ambulatory Visit: Payer: Medicaid Other

## 2020-01-13 ENCOUNTER — Ambulatory Visit: Payer: Medicaid Other

## 2020-07-13 DEATH — deceased

## 2021-04-14 IMAGING — US US MFM UA CORD DOPPLER
1 series · 14 of 28 positions shown · non-contrast
Comparison: none

[Series 1: us mfm ua cord doppler · 37 acquisitions, 14 frames shown]
[im 2/37]
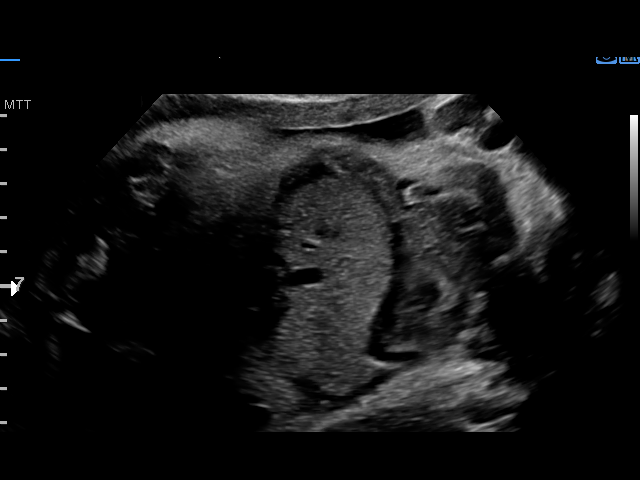
[im 5/37]
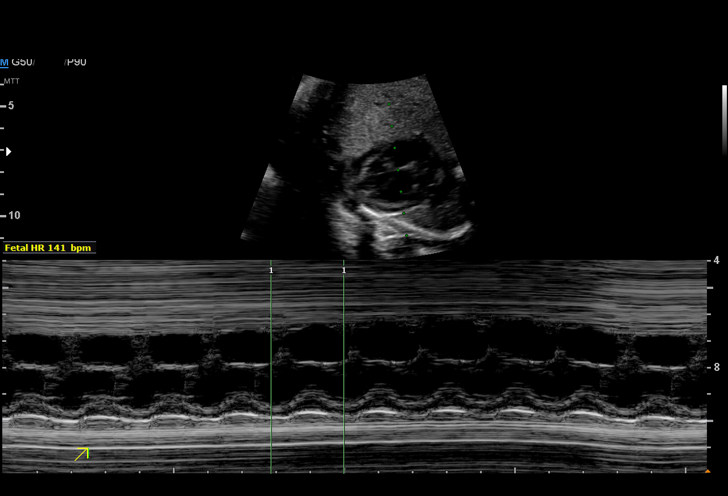
[im 7/37]
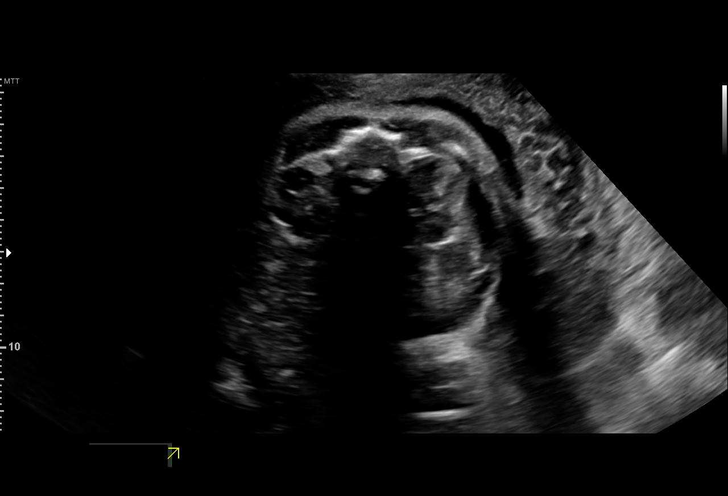
[im 10/37]
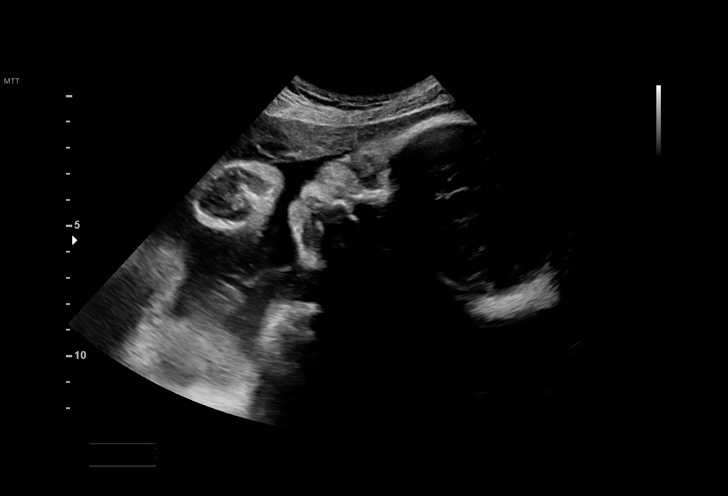
[im 13/37]
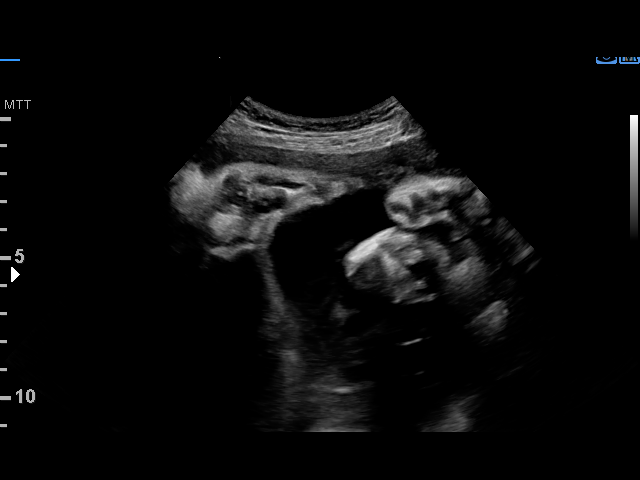
[im 15/37]
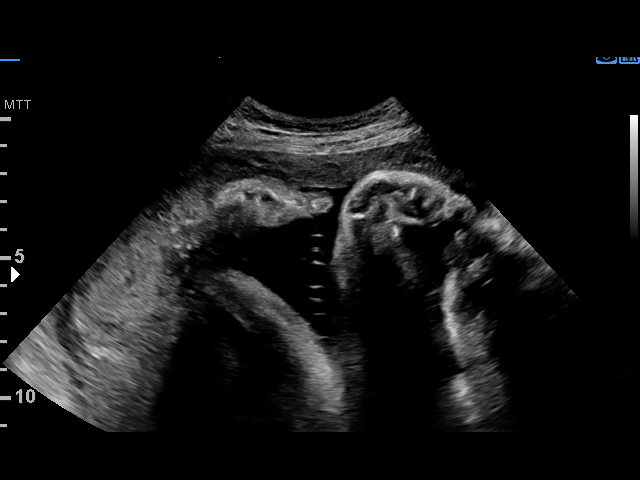
[im 18/37]
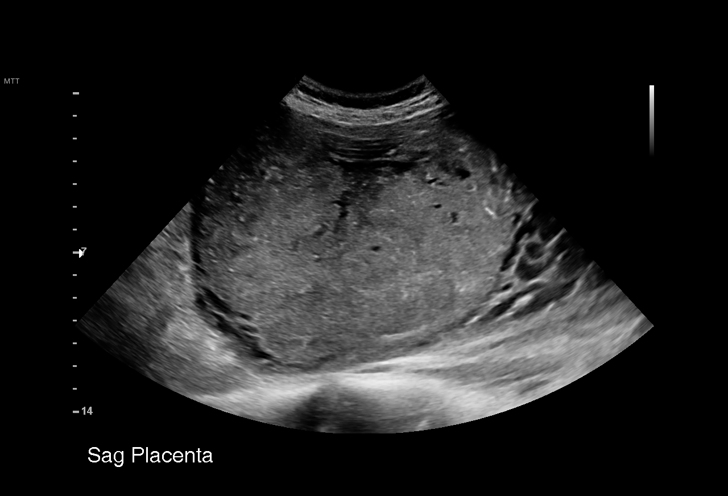
[im 21/37]
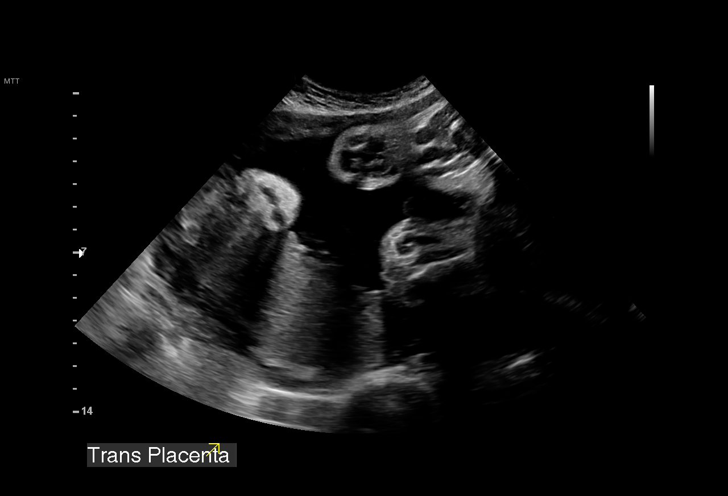
[im 23/37]
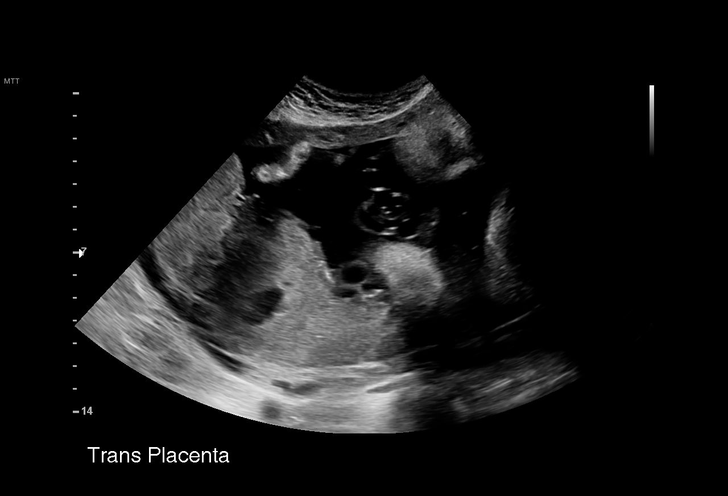
[im 26/37]
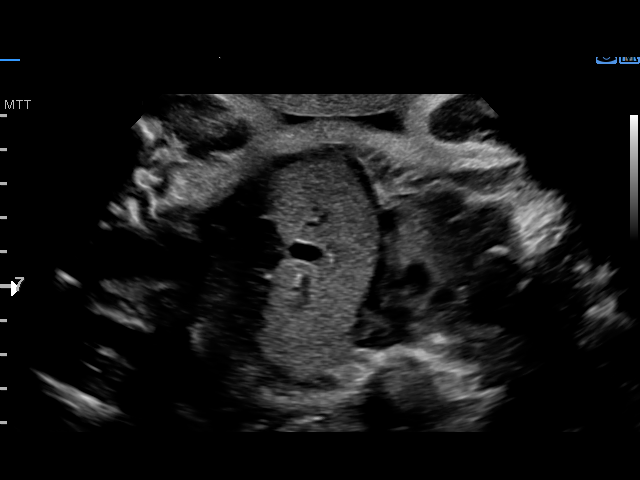
[im 29/37]
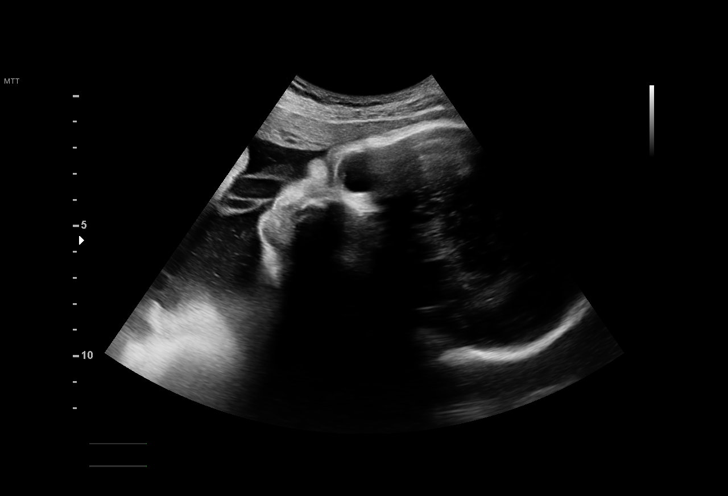
[im 31/37]
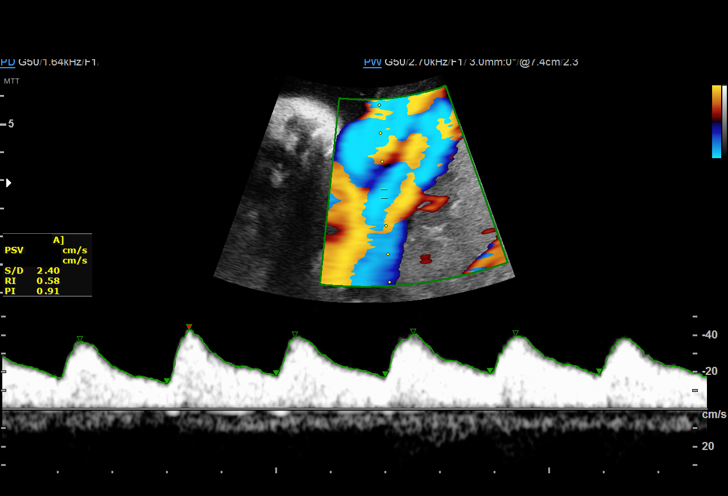
[im 34/37]
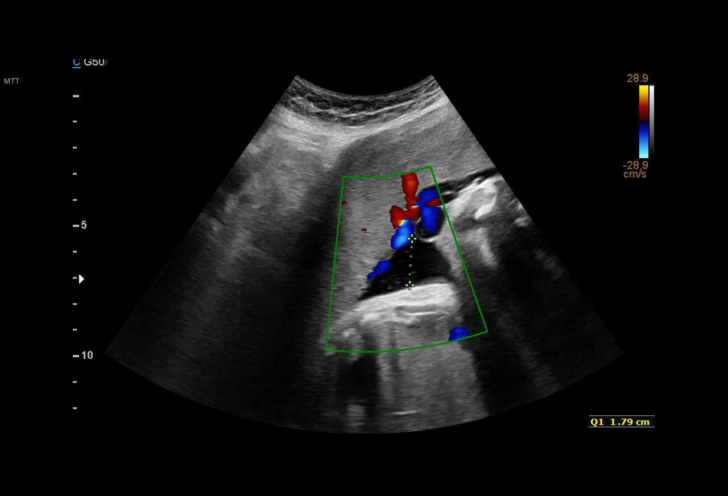
[im 37/37]
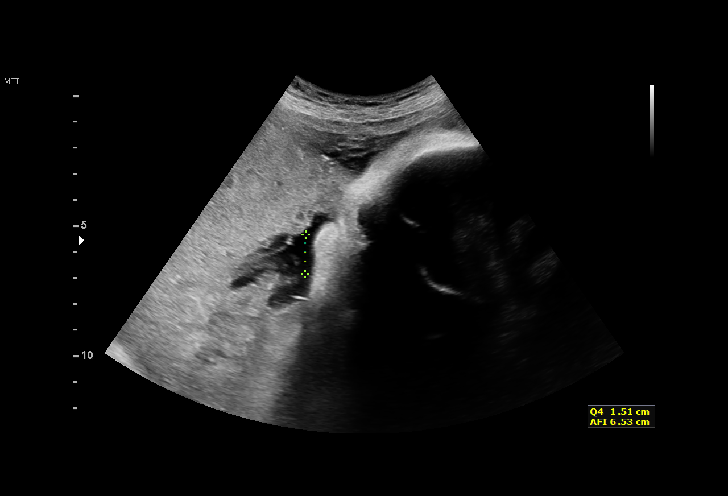

[14 of 28 positions shown; findings below may reference images not displayed]

Indications

 36 weeks gestation of pregnancy
 Poor obstetric history: Previous fetal growth
 restriction (FGR)
 Maternal Crohn's disease affecting
 pregnancy in third trime
 Seizure disorder (seizure during c/s in 4222)
 Poor obstetric history: Previous
 preeclampsia / eclampsia/gestational HTN
 History of cesarean delivery, currently
 pregnant (x2)
 Poor obstetric history: Previous preterm
 delivery, antepartum(@ 75.5w)
 History of sickle cell trait
 Encounter for other antenatal screening
 follow-up
Fetal Evaluation

 Num Of Fetuses:         1
 Fetal Heart Rate(bpm):  141
 Cardiac Activity:       Observed
 Presentation:           Cephalic
 Placenta:               Posterior
 P. Cord Insertion:      Previously Visualized

 Amniotic Fluid
 AFI FV:      Within normal limits

 AFI Sum(cm)     %Tile       Largest Pocket(cm)
 10              23          5
 RUQ(cm)       RLQ(cm)       LUQ(cm)        LLQ(cm)
 2.7           1
Biophysical Evaluation

 Amniotic F.V:   Pocket => 2 cm             F. Tone:        Observed
 F. Movement:    Observed                   Score:          [DATE]
 F. Breathing:   Observed
Biometry

 LV:          5  mm
OB History

 Gravidity:    5         Term:   1        Prem:   1        SAB:   1
 TOP:          1        Living:  2
Gestational Age

 Clinical EDD:  36w 3d                                        EDD:   08/17/19
 Best:          36w 3d     Det. By:  Clinical EDD             EDD:   08/17/19
Anatomy

 Ventricles:            Appears normal         Kidneys:                Appear normal
 Diaphragm:             Appears normal         Bladder:                Appears normal
 Stomach:               Appears normal, left
                        sided
Doppler - Fetal Vessels

 Umbilical Artery
  S/D     %tile      RI
  3.39       93

Cervix Uterus Adnexa

 Cervix
 Not visualized (advanced GA >97wks)

 Uterus
 No abnormality visualized.

 Right Ovary
 Not visualized.

 Left Ovary
 Not visualized.

 Cul De Sac
 No free fluid seen.

 Adnexa
 No adnexal mass visualized.
Impression

 Patient returned for BPP and UA Doppler study. Fetal growth
 restriction was diagnosed on 06/26/19 ultrasound when AC
 measurement was at less than the 5th percentile. On
 ultrasound performed last week, the estimated fetal weight
 was at the 17th percentile and the abdominal circumference
 was at the 18th percentile.
 Amniotic fluid is normal and good fetal activity is seen.
 Antenatal testing is reassuring. BPP [DATE]. Umbilical artery
 Doppler showed normal forward diastolic flow.
 We reassured the patient of the findings.
 Because of earlier finding of fetal growth restriction, history of
 growth restriction in previous baby and maternal Crohn's
 disease, I recommend continuing weekly BPP till delivery.
Recommendations

 -Weekly BPP till delivery.
 -UA Doppler to be discontinued.

                 [place], [place]
